# Patient Record
Sex: Female | Born: 1985 | Race: White | Hispanic: No | Marital: Married | Smoking: Never smoker
Health system: Southern US, Community
[De-identification: ages and names within clinical notes are randomized; demographics above are authoritative.]

## PROBLEM LIST (undated history)

## (undated) ENCOUNTER — Inpatient Hospital Stay (HOSPITAL_COMMUNITY): Payer: Self-pay

## (undated) DIAGNOSIS — F32A Depression, unspecified: Secondary | ICD-10-CM

## (undated) DIAGNOSIS — Z98891 History of uterine scar from previous surgery: Secondary | ICD-10-CM

## (undated) DIAGNOSIS — IMO0002 Reserved for concepts with insufficient information to code with codable children: Secondary | ICD-10-CM

## (undated) DIAGNOSIS — Z789 Other specified health status: Secondary | ICD-10-CM

## (undated) DIAGNOSIS — Q519 Congenital malformation of uterus and cervix, unspecified: Principal | ICD-10-CM

## (undated) DIAGNOSIS — K219 Gastro-esophageal reflux disease without esophagitis: Secondary | ICD-10-CM

## (undated) HISTORY — PX: APPENDECTOMY: SHX54

## (undated) HISTORY — PX: HYSTEROSCOPY: SHX211

## (undated) HISTORY — DX: Gastro-esophageal reflux disease without esophagitis: K21.9

## (undated) HISTORY — DX: Depression, unspecified: F32.A

---
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
## 2007-02-14 ENCOUNTER — Other Ambulatory Visit: Admission: RE | Admit: 2007-02-14 | Discharge: 2007-02-14 | Payer: Self-pay | Admitting: Gynecology

## 2010-12-05 NOTE — Op Note (Signed)
NAME:  Lindsay Horn, Lindsay Horn                     ACCOUNT NO.:  [id number]   MEDICAL RECORD NO.:  [id number]                   PATIENT TYPE:  OUT   LOCATION:  CATS                                 FACILITY:  [facility]   PHYSICIAN:  Prabhakar D. Pendse, M.D.           DATE OF BIRTH:  1985-12-02   DATE OF PROCEDURE:  05/01/2002  DATE OF DISCHARGE:                                 OPERATIVE REPORT   PREOPERATIVE DIAGNOSIS:  Acute appendicitis.   POSTOPERATIVE DIAGNOSIS:  Acute suppurative appendicitis, possible  perforation.   OPERATION PERFORMED:  Exploratory laparotomy and appendectomy.   SURGEON:  Prabhakar D. Levie Heritage, M.D.   ASSISTANT:  Leonia Corona, M.D.   ANESTHESIA:  Nurse.   OPERATIVE INDICATION:  This 25 year old girl was admitted with about a 30-  hour history of progressively worsening intermittent colicky, mid and lower  abdominal pains associated with anorexia,  nausea, vomiting, and low-grade  fever.  There was no history of abdominal trauma, no dysuria, no URI.  Physical findings were consistent with diffuse abdominal tenderness with  guarding in the right lower quadrant area.  There were no masses.  White  count initially was 9.2 with shift to the left.  Here and now, it is  unremarkable.  CT scan of the abdomen was questionable, hence the patient  was observed overnight, however since the symptomatology continued,  exploration was planned.   OPERATIVE FINDINGS:  Upon opening the right lower quadrant area, appendix  was noted to be lateral to the cecal wall with its distal half markedly  distended, edematous with some fibrinous material over the tip of the  appendix.  Although, no gross perforation was noted, there was some purulent  material in the right lower quadrant area.  There was an older suggestion of  possible earlier rupture.  Examination of the distal ileum showed no  evidence of ileitis or Meckel's diverticula.   OPERATIVE PROCEDURE:  Under satisfactory  general endotracheal anesthesia,  the patient in supine position.  The abdomen was thoroughly prepped and  draped in the usual manner.  About 2 inch long transverse incision was made  in the right lower quadrant area.  Skin and subcutaneous tissues were  incised.  Bleeders were individually clamped, cut and electrocoagulated.  Muscle was incised in the McBurney's fashion.  The peritoneal cavity was  entered.  The findings were as described above.  By gentle manipulation, the  appendix was exteriorized.  The appendiceal mesentery was serially clamped,  cut, and ligated with 2-0 silk.  Appendectomy was done in the routine  fashion.  The stump was inverted in the cecal wall with a 3-0 silk purse-  string suture.  Appropriate cultures were taken.  Examination of the distal  ileum was carried out.  There was no evidence of ileitis or Meckel's  diverticulum.  The bowel was returned to the peritoneal cavity.  The  peritoneal cavity was irrigated with copious amounts of saline.  Hemostasis  was accomplished.  Sponge and needle counts were correct.  The peritoneum  was closed with 2-0 Vicryl running interlocking sutures, individual muscles  with 2-0 Vicryl interrupted sutures, subcutaneous tissue with 2-0  Vicryl interrupted sutures, and the skin was closed with 4-0 Monocryl  subcuticular sutures.  Steri-Strips were applied.  Throughout the procedure,  the patient's vital signs remained stable.  The patient withstood the  procedure well and was transferred to the recovery room in satisfactory  general condition.                                                Prabhakar D. Levie Heritage, M.D.    PDP/MEDQ  D:  05/01/2002  T:  05/01/2002  Job:  [id number]   cc:   Linward Headland, M.D.

## 2010-12-05 NOTE — Discharge Summary (Signed)
   NAME:  Lindsay Horn, Lindsay Horn                     ACCOUNT NO.:  [id number]   MEDICAL RECORD NO.:  [id number]                   PATIENT TYPE:  INP   LOCATION:  6125                                 FACILITY:  [facility]   PHYSICIAN:  Prabhakar D. Pendse, M.D.           DATE OF BIRTH:  1986-05-10   DATE OF ADMISSION:  05/01/2002  DATE OF DISCHARGE:  05/04/2002                                 DISCHARGE SUMMARY   REASON FOR ADMISSION:  The patient is a 25 year old previously healthy  Caucasian female who was hospitalized status post appendectomy and possible  perforation performed by pediatric surgeon, Dr. Levie Heritage on 05/01/2002.   HOSPITAL COURSE:  Postoperative course was complicated by fever to 101.5 on  postoperative day #2.  Wound clean, dry, and intact.  Abdominal pain no  greater than expected.  The patient was on Unasyn 2 mg q.6h. IV x 72 hours  in the hospital. The patient was given Vicodin for pain.   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   LABORATORY DATA:  Final CBC on 05/04/2002 showed WBCs 5.2, hemoglobin 11.6,  hematocrit 35.0, platelets 167, neutrophils 68, lymphs 22, monos 8,  eosinophils 2, ANC 3.5.  Peritoneal culture was no growth x 2 days and was  final.   PROCEDURES:  Laparotomy and appendectomy performed on 05/01/2002.   CONDITION ON DISCHARGE:  The patient was taking p.o. well, minimal pain from  incision.  The pain was well controlled on Vicodin.  The patient was  afebrile.  The family was instructed to follow up with Dr. Levie Heritage in one  week appointment made for 05/11/2002 at 4 p.m.   DISCHARGE MEDICATIONS:  Vicodin 16 pills.     Salem Senate, M.S. 4                      Prabhakar D. Levie Heritage, M.D.    LT/MEDQ  D:  05/04/2002  T:  05/06/2002  Job:  [id number]   cc:   Dr. Meryl Crutch [phone]   Trudie Reed, MD  [facility] [phone])

## 2011-03-04 ENCOUNTER — Other Ambulatory Visit: Payer: Self-pay | Admitting: Dermatology

## 2011-08-11 ENCOUNTER — Other Ambulatory Visit: Payer: Self-pay | Admitting: Gynecology

## 2012-09-07 ENCOUNTER — Other Ambulatory Visit: Payer: Self-pay | Admitting: Gynecology

## 2013-02-05 ENCOUNTER — Ambulatory Visit (INDEPENDENT_AMBULATORY_CARE_PROVIDER_SITE_OTHER): Payer: BC Managed Care – PPO | Admitting: Emergency Medicine

## 2013-02-05 VITALS — BP 120/80 | HR 93 | Temp 98.0°F | Resp 16 | Ht 64.0 in | Wt 277.0 lb

## 2013-02-05 DIAGNOSIS — N764 Abscess of vulva: Secondary | ICD-10-CM

## 2013-02-05 MED ORDER — SULFAMETHOXAZOLE-TRIMETHOPRIM 800-160 MG PO TABS: 1.0000 | ORAL_TABLET | Freq: Two times a day (BID) | ORAL | Status: DC

## 2013-02-05 NOTE — Progress Notes (Signed)
[facility] [address] [place] [address] [phone]  Date:  02/05/2013   Name:  Lindsay Horn   DOB:  [birth year]   MRN:  [id number]  PCP:  No primary provider on file.    Chief Complaint: Vaginal Pain   History of Present Illness:  Lindsay Horn is a 27 y.o. very pleasant female patient who presents with the following:  Painful area in right posterior introitus.  No mass. No discharge or dysuria. No fever or chills.  Last intercourse Monday.  On NUVA ring.  No improvement with over the counter medications or other home remedies. Denies other complaint or health concern today.   There are no active problems to display for this patient.   History reviewed. No pertinent past medical history.  Past Surgical History  Procedure Laterality Date  . Appendectomy      History  Substance Use Topics  . Smoking status: Never Smoker   . Smokeless tobacco: Not on file  . Alcohol Use: Yes    History reviewed. No pertinent family history.  Allergies  Allergen Reactions  . Codeine Hives    Medication list has been reviewed and updated.  No current outpatient prescriptions on file prior to visit.   No current facility-administered medications on file prior to visit.    Review of Systems:  As per HPI, otherwise negative.    Physical Examination: Filed Vitals:   02/05/13 0957  BP: 120/80  Pulse: 93  Temp: 98 F (36.7 C)  Resp: 16   Filed Vitals:   02/05/13 0957  Height: 5\' 4"  (1.626 m)  Weight: 277 lb (125.646 kg)   Body mass index is 47.52 kg/(m^2). Ideal Body Weight: Weight in (lb) to have BMI = 25: 145.3   GEN: WDWN, NAD, Non-toxic, Alert & Oriented x 3 HEENT: Atraumatic, Normocephalic.  Ears and Nose: No external deformity. EXTR: No clubbing/cyanosis/edema NEURO: Normal gait.  PSYCH: Normally interactive. Conversant. Not depressed or anxious appearing.  Calm demeanor.  Abscess posterior right labia  majora   Assessment and Plan: Aspirated 1.5 ml purulent material Septra SITZ Gyn consultation  Signed,  Phillips Odor, MD

## 2013-02-05 NOTE — Addendum Note (Signed)
Addended by: Elease Etienne A on: 02/05/2013 10:53 AM   Modules accepted: Orders

## 2013-02-05 NOTE — Patient Instructions (Addendum)
Abscess An abscess is an infected area that contains a collection of pus and debris.It can occur in almost any part of the body. An abscess is also known as a furuncle or boil. CAUSES  An abscess occurs when tissue gets infected. This can occur from blockage of oil or sweat glands, infection of hair follicles, or a minor injury to the skin. As the body tries to fight the infection, pus collects in the area and creates pressure under the skin. This pressure causes pain. People with weakened immune systems have difficulty fighting infections and get certain abscesses more often.  SYMPTOMS Usually an abscess develops on the skin and becomes a painful mass that is red, warm, and tender. If the abscess forms under the skin, you may feel a moveable soft area under the skin. Some abscesses break open (rupture) on their own, but most will continue to get worse without care. The infection can spread deeper into the body and eventually into the bloodstream, causing you to feel ill.  DIAGNOSIS  Your caregiver will take your medical history and perform a physical exam. A sample of fluid may also be taken from the abscess to determine what is causing your infection. TREATMENT  Your caregiver may prescribe antibiotic medicines to fight the infection. However, taking antibiotics alone usually does not cure an abscess. Your caregiver may need to make a small cut (incision) in the abscess to drain the pus. In some cases, gauze is packed into the abscess to reduce pain and to continue draining the area. HOME CARE INSTRUCTIONS   Only take over-the-counter or prescription medicines for pain, discomfort, or fever as directed by your caregiver.  If you were prescribed antibiotics, take them as directed. Finish them even if you start to feel better.  If gauze is used, follow your caregiver's directions for changing the gauze.  To avoid spreading the infection:  Keep your draining abscess covered with a  bandage.  Wash your hands well.  Do not share personal care items, towels, or whirlpools with others.  Avoid skin contact with others.  Keep your skin and clothes clean around the abscess.  Keep all follow-up appointments as directed by your caregiver. SEEK MEDICAL CARE IF:   You have increased pain, swelling, redness, fluid drainage, or bleeding.  You have muscle aches, chills, or a general ill feeling.  You have a fever. MAKE SURE YOU:   Understand these instructions.  Will watch your condition.  Will get help right away if you are not doing well or get worse. Document Released: 04/15/2005 Document Revised: 01/05/2012 Document Reviewed: 09/18/2011 ExitCare Patient Information 2014 [employer].  

## 2013-02-07 LAB — WOUND CULTURE: Gram Stain: NONE SEEN

## 2013-04-12 ENCOUNTER — Ambulatory Visit (INDEPENDENT_AMBULATORY_CARE_PROVIDER_SITE_OTHER): Payer: BC Managed Care – PPO

## 2013-04-12 VITALS — BP 126/80 | HR 82 | Temp 99.1°F | Resp 16 | Ht 64.5 in | Wt 227.4 lb

## 2013-04-12 DIAGNOSIS — Z111 Encounter for screening for respiratory tuberculosis: Secondary | ICD-10-CM

## 2013-04-12 NOTE — Progress Notes (Signed)
  Subjective:    Patient ID: Lindsay Horn, female    DOB: 1986-06-30, 27 y.o.   MRN: [id number]  HPI   Tuberculosis Risk Questionnaire  1. No Were you born outside the [place] in one of the following parts of the world: [place], [place], [place], [place] or [place]?    2. No Have you traveled outside the [place] and lived for more than one month in one of the following parts of the world: [place], [place], [place], [place] or [place]?    3. No Do you have a compromised immune system such as from any of the following conditions:HIV/AIDS, organ or bone marrow transplantation, diabetes, immunosuppressive medicines (e.g. Prednisone, Remicaide), leukemia, lymphoma, cancer of the head or neck, gastrectomy or jejunal bypass, end-stage renal disease (on dialysis), or silicosis?     4. No Have you ever or do you plan on working in: a residential care center, a health care facility, a jail or prison or homeless shelter?    5. No Have you ever: injected illegal drugs, used crack cocaine, lived in a homeless shelter  or been in jail or prison?     6. No Have you ever been exposed to anyone with infectious tuberculosis?    Tuberculosis Symptom Questionnaire  Do you currently have any of the following symptoms?  1. No Unexplained cough lasting more than 3 weeks?   2. No Unexplained fever lasting more than 3 weeks.   3. No Night Sweats (sweating that leaves the bedclothes and sheets wet)     4. No Shortness of Breath   5. No Chest Pain   6. No Unintentional weight loss    7. No Unexplained fatigue (very tired for no reason)     Review of Systems     Objective:   Physical Exam        Assessment & Plan:    Pt screened for TB and it is not indicated. Note given to pt. Reviewed with Frances Furbish PA-C

## 2013-05-18 ENCOUNTER — Ambulatory Visit (INDEPENDENT_AMBULATORY_CARE_PROVIDER_SITE_OTHER): Payer: BC Managed Care – PPO | Admitting: Family Medicine

## 2013-05-18 ENCOUNTER — Encounter: Payer: Self-pay | Admitting: Family Medicine

## 2013-05-18 VITALS — BP 112/68 | HR 90 | Temp 98.8°F | Resp 16 | Ht 64.5 in | Wt 276.6 lb

## 2013-05-18 DIAGNOSIS — Z0000 Encounter for general adult medical examination without abnormal findings: Secondary | ICD-10-CM

## 2013-05-18 NOTE — Progress Notes (Signed)
[facility]:  Office Visit  Chief Complaint:  Chief Complaint  Patient presents with  . Annual Exam    employment    HPI: Lindsay Horn is a 27 y.o. female who is here for here for CPE She is teaching at [employer], 1st grade teacher.  She started in Sept.  Declines bloord work She has a gynecologist No abnormal paps, last pap was in March 2014  History reviewed. No pertinent past medical history. Past Surgical History  Procedure Laterality Date  . Appendectomy     History   Social History  . Marital Status: Single    Spouse Name: N/A    Number of Children: N/A  . Years of Education: N/A   Social History Main Topics  . Smoking status: Never Smoker   . Smokeless tobacco: None  . Alcohol Use: Yes  . Drug Use: No  . Sexual Activity: Yes   Other Topics Concern  . None   Social History Narrative  . None   History reviewed. No pertinent family history. Allergies  Allergen Reactions  . Codeine Hives   Prior to Admission medications   Medication Sig Start Date End Date Taking? Authorizing Provider  etonogestrel-ethinyl estradiol (NUVARING) 0.12-0.015 MG/24HR vaginal ring Place 1 each vaginally every 28 (twenty-eight) days. Insert vaginally and leave in place for 3 consecutive weeks, then remove for 1 week.   Yes Historical Provider, MD  sulfamethoxazole-trimethoprim (BACTRIM DS,SEPTRA DS) 800-160 MG per tablet Take 1 tablet by mouth 2 (two) times daily. 02/05/13   Phillips Odor, MD     ROS: The patient denies fevers, chills, night sweats, unintentional weight loss, chest pain, palpitations, wheezing, dyspnea on exertion, nausea, vomiting, abdominal pain, dysuria, hematuria, melena, numbness, weakness, or tingling.   All other systems have been reviewed and were otherwise negative with the exception of those mentioned in the HPI and as above.    PHYSICAL EXAM: Filed Vitals:   05/18/13 1629  BP: 112/68  Pulse: 90  Temp:  98.8 F (37.1 C)  Resp: 16   Filed Vitals:   05/18/13 1629  Height: 5' 4.5" (1.638 m)  Weight: 276 lb 9.6 oz (125.465 kg)   Body mass index is 46.76 kg/(m^2).  General: Alert, no acute distress HEENT:  Normocephalic, atraumatic, oropharynx patent. EOMI, PERRLA, fundoscopic exam nl, no thyroidmegaly Cardiovascular:  Regular rate and rhythm, no rubs murmurs or gallops.  No Carotid bruits, radial pulse intact. No pedal edema.  Respiratory: Clear to auscultation bilaterally.  No wheezes, rales, or rhonchi.  No cyanosis, no use of accessory musculature GI: No organomegaly, abdomen is soft and non-tender, positive bowel sounds.  No masses. Skin: No rashes. Neurologic: Facial musculature symmetric. Psychiatric: Patient is appropriate throughout our interaction. Lymphatic: No cervical lymphadenopathy Musculoskeletal: Gait intact. 5/5 strngth, full ROM in UE and Lindsay Horn , 2/2 DTRs   LABS: Results for orders placed in visit on 02/05/13  WOUND CULTURE      Result Value Range   Culture Moderate ESCHERICHIA COLI     Gram Stain No WBC Seen     Gram Stain No Squamous Epithelial Cells Seen     Gram Stain Moderate Gram Negative Rods     Organism ID, Bacteria ESCHERICHIA COLI       EKG/XRAY:   Primary read interpreted by Dr. Conley Rolls at [facility].   ASSESSMENT/PLAN: Encounter Diagnosis  Name Primary?  . Annual physical exam Yes   Declined labs , breast or pelvic/pap exam She gets  it with ob/gyn and will do that in March Filled out forms for school, no restrictions F/u prn  Gross sideeffects, risk and benefits, and alternatives of medications d/w patient. Patient is aware that all medications have potential sideeffects and we are unable to predict every sideeffect or drug-drug interaction that may occur.  Hamilton Capri PHUONG, DO 05/18/2013 4:56 PM

## 2014-01-23 ENCOUNTER — Ambulatory Visit (INDEPENDENT_AMBULATORY_CARE_PROVIDER_SITE_OTHER): Payer: BC Managed Care – PPO

## 2014-01-23 ENCOUNTER — Telehealth: Payer: Self-pay | Admitting: *Deleted

## 2014-01-23 VITALS — BP 127/62 | HR 82 | Resp 12

## 2014-01-23 DIAGNOSIS — R52 Pain, unspecified: Secondary | ICD-10-CM

## 2014-01-23 DIAGNOSIS — I872 Venous insufficiency (chronic) (peripheral): Secondary | ICD-10-CM

## 2014-01-23 DIAGNOSIS — R609 Edema, unspecified: Secondary | ICD-10-CM

## 2014-01-23 DIAGNOSIS — M722 Plantar fascial fibromatosis: Secondary | ICD-10-CM

## 2014-01-23 MED ORDER — MELOXICAM 15 MG PO TABS: 15.0000 mg | ORAL_TABLET | Freq: Every day | ORAL | Status: DC

## 2014-01-23 NOTE — Patient Instructions (Signed)
Plantar Fasciitis Plantar fasciitis is a common condition that causes foot pain. It is soreness (inflammation) of the band of tough fibrous tissue on the bottom of the foot that runs from the heel bone (calcaneus) to the ball of the foot. The cause of this soreness may be from excessive standing, poor fitting shoes, running on hard surfaces, being overweight, having an abnormal walk, or overuse (this is common in runners) of the painful foot or feet. It is also common in aerobic exercise dancers and ballet dancers. SYMPTOMS  Most people with plantar fasciitis complain of:  Severe pain in the morning on the bottom of their foot especially when taking the first steps out of bed. This pain recedes after a few minutes of walking.  Severe pain is experienced also during walking following a long period of inactivity.  Pain is worse when walking barefoot or up stairs DIAGNOSIS   Your caregiver will diagnose this condition by examining and feeling your foot.  Special tests such as X-rays of your foot, are usually not needed. PREVENTION   Consult a sports medicine professional before beginning a new exercise program.  Walking programs offer a good workout. With walking there is a lower chance of overuse injuries common to runners. There is less impact and less jarring of the joints.  Begin all new exercise programs slowly. If problems or pain develop, decrease the amount of time or distance until you are at a comfortable level.  Wear good shoes and replace them regularly.  Stretch your foot and the heel cords at the back of the ankle (Achilles tendon) both before and after exercise.  Run or exercise on even surfaces that are not hard. For example, asphalt is better than pavement.  Do not run barefoot on hard surfaces.  If using a treadmill, vary the incline.  Do not continue to workout if you have foot or joint problems. Seek professional help if they do not improve. HOME CARE INSTRUCTIONS     Avoid activities that cause you pain until you recover.  Use ice or cold packs on the problem or painful areas after working out.  Only take over-the-counter or prescription medicines for pain, discomfort, or fever as directed by your caregiver.  Soft shoe inserts or athletic shoes with air or gel sole cushions may be helpful.  If problems continue or become more severe, consult a sports medicine caregiver or your own health care provider. Cortisone is a potent anti-inflammatory medication that may be injected into the painful area. You can discuss this treatment with your caregiver. MAKE SURE YOU:   Understand these instructions.  Will watch your condition.  Will get help right away if you are not doing well or get worse. Document Released: 03/31/2001 Document Revised: 09/28/2011 Document Reviewed: 05/30/2008 ExitCare Patient Information 2015 [employer]. This information is not intended to replace advice given to you by your health care provider. Make sure you discuss any questions you have with your health care provider.    ICE INSTRUCTIONS  Apply ice or cold pack to the affected area at least 3 times a day for 10-15 minutes each time.  You should also use ice after prolonged activity or vigorous exercise.  Do not apply ice longer than 20 minutes at one time.  Always keep a cloth between your skin and the ice pack to prevent burns.  Being consistent and following these instructions will help control your symptoms.  We suggest you purchase a gel ice pack because they are   reusable and do bit leak.  Some of them are designed to wrap around the area.  Use the method that works best for you.  Here are some other suggestions for icing.   Use a frozen bag of peas or corn-inexpensive and molds well to your body, usually stays frozen for 10 to 20 minutes.  Wet a towel with cold water and squeeze out the excess until it's damp.  Place in a bag in the freezer for 20 minutes. Then remove  and use. 

## 2014-01-23 NOTE — Progress Notes (Signed)
   Subjective:    Patient ID: Rosalva Ferron, female    DOB: 29-Jan-1986, 28 y.o.   MRN: [id number]  HPI  PT STATED LT BOTTOM OF THE FOOT IS HURTING AND SWOLLEN FOR 1 YEAR. THE FOOT IS GETTING WORSE. THE FOOT GETTING WORSE AND IT AGGRAVATED BY WALKING. TRIED TO USED ADVIL, ICE, AND COMPRESSION SOCKS AND IT HELPS SOME.   Review of Systems  Cardiovascular: Positive for leg swelling.       Objective:   Physical Exam 28 year old white female well-developed well-nourished oriented x3. She is slightly overweight her has significant swelling and edema left more so than right lower leg and ankle areas. Has significant swelling in the left forefoot as well.  Lower straight objective findings vascular status is intact with pedal pulses palpable DP +2/4 PT plus one over 4 bilateral capillary fill time 3 seconds all digits epicritic and proprioceptive sensations intact and symmetric bilateral. There is +2 edema on the left plus one on the right. No open wounds or ulcerations are noted on exam the patient has some diffuse tenderness in the left forefoot possibly early neuroma symptomology capsulitis to the edema. Patient also exam has pain tenderness on palpation of the plantar fascia a mid arch area from inferior heel to arch although not significant there is x-rays reveal mild inferior calcaneal spurring thickening plantar fascial structures no signs of fracture or other osseous abnormalities are noted rectus foot otherwise identified. Significant soft tissue swelling is noted. Remainder the exam is unremarkable mild flexible digital contractures are noted       Assessment & Plan:  Assessment this time mild early plantar fasciitis secondary gait abnormalities and edema may cannot rule out mild early neuroma symptomology do the edema and compression with slight capsulitis being noted as well. However the [employer] for issues is the edema possible of edema associated left foot more so than right  patient has tried compression stockings in the past to help temporarily advised she is to maintain compression stockings and prescription for [facility] for knee-high compression stocking is dispensed. Patient is also referred to the pain center at [facility] for evaluation of lymphedema and appropriate treatment patient is advised that is cosmetic that may be, leave things like blood clots ulcerations or skin breakdown. In the interim will initiate compression stockings as prescribed also prescription for MOBIC 15 mg once daily is given at this time. Reevaluate in 3 or 4 weeks for followup she continues have problems or difficulties may be candidate for her fasciitis treatment possibly the orthoses Nadara Mustard this time with the edema I do feel she would have difficulty with any type shoe treatments or conservative care which avoid strapping do the edema as well this time. In reappointed 4 weeks for for Dr. Dierdre Harness is made at this time .  Harriet Masson DPM

## 2014-01-23 NOTE — Telephone Encounter (Signed)
Referral was made for [facility] for chronic edema left leg more so than the right leg, rule out Lymphedema per Dr. Blenda Mounts.  Appointment was scheduled for 01/24/2014 at 11:30am.  Diagnosis is Venous Insufficiency, symptoms is leg swelling.   [facility] [id number] [place], [place] [address] Fax # [phone]

## 2014-02-09 ENCOUNTER — Telehealth: Payer: Self-pay | Admitting: *Deleted

## 2014-02-09 NOTE — Telephone Encounter (Signed)
I saw him on 07/07.  I went to [facility] to get the compression the same day that I came there.  They had to order them.  They have not come in yet.  Should I postpone my appointment to see him since I haven't used them yet?  Seen a little improvement with the swelling and Plantar Fasciitis.   I told her to keep the appointment, he is following up on the Plantar Fasciitis.  She stated okay thank you.

## 2014-02-14 ENCOUNTER — Ambulatory Visit (INDEPENDENT_AMBULATORY_CARE_PROVIDER_SITE_OTHER): Payer: BC Managed Care – PPO

## 2014-02-14 VITALS — BP 191/97 | HR 85 | Resp 17

## 2014-02-14 DIAGNOSIS — I872 Venous insufficiency (chronic) (peripheral): Secondary | ICD-10-CM

## 2014-02-14 DIAGNOSIS — M722 Plantar fascial fibromatosis: Secondary | ICD-10-CM

## 2014-02-14 DIAGNOSIS — R609 Edema, unspecified: Secondary | ICD-10-CM

## 2014-02-14 NOTE — Progress Notes (Signed)
Subjective:     Patient ID: Rosalva Ferron, female   DOB: Dec 13, 1985, 28 y.o.   MRN: [id number]  HPI Pt presents for f/u for pf pain in left foot, states she has noticed some improvement in pain but still has bilateral edema.  Review of Systems no new findings or systemic changes noted     Objective:   Physical Exam Lower extremity objective findings as follows vascular status is intact there still edema left more so than right +2 on the left was 1 on the right patient had vascular studies revealed no blockages no signs of clots or any other vascular abnormality recommended to continue with compression stockings that were prescribed. Masker status is intact pedal pulses DP postal for PT plus one over 4 bilateral refill time 3 seconds. There is no appreciable pain on palpation of the medial band of the plantar fascia left or right foot patient wearing Birkenstock type shoes crocs in sandals at all times no barefoot or flimsy shoes or flip-flops    Assessment:     Assessment this time is venous insufficiency and edema patient we started wearing compression stockings today the last prescription and she did get her stockings until this week. Advised to maintain compression stockings daily as instructed for the next 2-3 months to help with improved patient's plantar fasciitis has resolved well with use a good stable shoes may be candidate for functional orthoses in the future    Plan:     Maintain ice elevation and compression stockings to keep down the edema maintain good stable athletic or walking shoes may be candidate for functional orthoses in the future based on progress her failure to improve next  Harriet Masson DPM

## 2014-02-14 NOTE — Patient Instructions (Signed)
ICE INSTRUCTIONS  Apply ice or cold pack to the affected area at least 3 times a day for 10-15 minutes each time.  You should also use ice after prolonged activity or vigorous exercise.  Do not apply ice longer than 20 minutes at one time.  Always keep a cloth between your skin and the ice pack to prevent burns.  Being consistent and following these instructions will help control your symptoms.  We suggest you purchase a gel ice pack because they are reusable and do bit leak.  Some of them are designed to wrap around the area.  Use the method that works best for you.  Here are some other suggestions for icing.   Use a frozen bag of peas or corn-inexpensive and molds well to your body, usually stays frozen for 10 to 20 minutes.  Wet a towel with cold water and squeeze out the excess until it's damp.  Place in a bag in the freezer for 20 minutes. Then remove and use  Also keep feet elevated whenever possible and maintain the use of compression stockings as instructed  .

## 2014-02-27 ENCOUNTER — Ambulatory Visit (INDEPENDENT_AMBULATORY_CARE_PROVIDER_SITE_OTHER): Payer: BC Managed Care – PPO | Admitting: Family Medicine

## 2014-02-27 ENCOUNTER — Ambulatory Visit (INDEPENDENT_AMBULATORY_CARE_PROVIDER_SITE_OTHER): Payer: BC Managed Care – PPO

## 2014-02-27 VITALS — BP 110/60 | HR 78 | Temp 98.0°F | Resp 16 | Ht 64.5 in | Wt 287.0 lb

## 2014-02-27 DIAGNOSIS — S6390XA Sprain of unspecified part of unspecified wrist and hand, initial encounter: Secondary | ICD-10-CM

## 2014-02-27 DIAGNOSIS — S6392XA Sprain of unspecified part of left wrist and hand, initial encounter: Secondary | ICD-10-CM

## 2014-02-27 DIAGNOSIS — E669 Obesity, unspecified: Secondary | ICD-10-CM | POA: Insufficient documentation

## 2014-02-27 NOTE — Patient Instructions (Signed)
Use the wrist brace as needed for thumb soreness.  You can also use ice and ibuprofen as needed.  Let us know if you thumb is not better in the next few days- Sooner if worse.

## 2014-02-27 NOTE — Progress Notes (Signed)
[facility] [address] [place] [address] [phone]  Date:  02/27/2014   Name:  Lindsay Horn   DOB:  [birth year]   MRN:  [id number]  PCP:  Haywood Pao, MD    Chief Complaint: Finger Injury, Edema and Bleeding/Bruising   History of Present Illness:  Lindsay Horn is a 28 y.o. very pleasant female patient who presents with the following:  She is here today with a left thum injury that occurred yesterday while tubing behind a boat yesterday.  She fell and caught her thumb in the handle of the tube.  It swelled and hurt right away- she noted notes bruise and continued swelling. No other injury, she otherwise feels well  There are no active problems to display for this patient.   History reviewed. No pertinent past medical history.  Past Surgical History  Procedure Laterality Date  . Appendectomy      History  Substance Use Topics  . Smoking status: Never Smoker   . Smokeless tobacco: Not on file  . Alcohol Use: Yes    History reviewed. No pertinent family history.  Allergies  Allergen Reactions  . Codeine Hives    Medication list has been reviewed and updated.  Current Outpatient Prescriptions on File Prior to Visit  Medication Sig Dispense Refill  . ibuprofen (ADVIL,MOTRIN) 100 MG tablet Take 100 mg by mouth every 6 (six) hours as needed for fever.      . meloxicam (MOBIC) 15 MG tablet Take 1 tablet (15 mg total) by mouth daily.  30 tablet  1  . Multiple Vitamin (MULTIVITAMIN) capsule Take 1 capsule by mouth daily.       No current facility-administered medications on file prior to visit.    Review of Systems:  As per HPI- otherwise negative.  Physical Examination: Filed Vitals:   02/27/14 1440  BP: 110/60  Pulse: 78  Temp: 98 F (36.7 C)  Resp: 16   Filed Vitals:   02/27/14 1440  Height: 5' 4.5" (1.638 m)  Weight: 287 lb (130.182 kg)   Body mass index is 48.52 kg/(m^2). Ideal Body Weight: Weight in  (lb) to have BMI = 25: 147.6  GEN: WDWN, NAD, Non-toxic, A & O x 3, obese, looks well HEENT: Atraumatic, Normocephalic. Neck supple. No masses, No LAD. Ears and Nose: No external deformity. CV: RRR, No M/G/R. No JVD. No thrill. No extra heart sounds. PULM: CTA B, no wheezes, crackles, rhonchi. No retractions. No resp. distress. No accessory muscle use. EXTR: No c/c/e NEURO Normal gait.  PSYCH: Normally interactive. Conversant. Not depressed or anxious appearing.  Calm demeanor.  Left thumb: she is tender and bruised over the 1st MC.  Pain with movement of the first MCP joint, but thumb IP joint is non- tender. She has good movement of the IP joint but cannot adduct her thumb well due to pain  She is right handed  [facility] reading (PRIMARY) by  Dr. Lorelei Pont. Left hand: no fracture  Assessment and Plan: Sprain of left hand, initial encounter - Plan: DG Hand Complete Left  Placed in a left thumb spica splint for thumb sprain.  She will let me know if not better in the next few days- Sooner if worse.     Signed Lamar Blinks, MD

## 2014-05-16 ENCOUNTER — Ambulatory Visit: Payer: BC Managed Care – PPO

## 2014-12-11 ENCOUNTER — Encounter (HOSPITAL_COMMUNITY): Payer: Self-pay | Admitting: Obstetrics and Gynecology

## 2014-12-11 DIAGNOSIS — IMO0002 Reserved for concepts with insufficient information to code with codable children: Secondary | ICD-10-CM

## 2014-12-11 HISTORY — DX: Reserved for concepts with insufficient information to code with codable children: IMO0002

## 2014-12-11 NOTE — H&P (Addendum)
Lindsay Horn is a 29 y.o. female G1P0 at 9+ with IUFD, noted in office by [place] after presentation for VB and cramping/back pain.  FHTs unable to be obtained externally, Formal US reveals IUFD.  Pt with known bicornuate uterus.  Femur length c/w 13 + week, last OB visit, FHT at this time by BS [place].  D/W pt options formanagement.  D/W pt r/b/a - pts for D&E - will perform with US guidance.    Maternal Medical History:  Reason for admission: Nausea.     OB History    No data available     No abn pap, no STD Known bicornuate uterus  Past Medical History  Diagnosis Date  . Fetal demise, less than 22 weeks 12/11/2014  dysmenorrhea, irregular cycles - PCOS.  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   HENT: Negative.   Eyes: Negative.   Respiratory: Negative.   Cardiovascular: Negative.   Gastrointestinal: Positive for nausea and vomiting.  Genitourinary: Negative.   Musculoskeletal: Positive for back pain.  Skin: Negative.   Neurological: Negative.   Psychiatric/Behavioral: Negative.       There were no vitals taken for this visit. Maternal Exam:  Abdomen: Patient reports no abdominal tenderness. Introitus: Normal vulva. Normal vagina.    Physical Exam  Constitutional: She is oriented to person, place, and time. She appears well-developed and well-nourished.  obese  HENT:  Head: Normocephalic and atraumatic.  Cardiovascular: Normal rate and regular rhythm.   Respiratory: Effort normal and breath sounds normal. No respiratory distress. She has no wheezes.  GI: Soft. Bowel sounds are normal. She exhibits no distension. There is no tenderness.  Musculoskeletal: Normal range of motion.  Neurological: She is alert and oriented to person, place, and time.   Skin: Skin is warm and dry.  Psychiatric: She has a normal mood and affect. Her behavior is normal.    Prenatal labs: ABO, Rh:  A+ Antibody:  negative Rubella:  immune RPR:   NR HBsAg:   negative HIV:   negative GBS:   unknown  Assessment/Plan: 28yo G1 at 16+ with IUFD, measuring 13+ week D/w pt r/b/a of D&E with US guidance Doxycycline for prophylaxis     Bovard-Stuckert, Beulah Capobianco 12/11/2014, 9:25 PM

## 2014-12-12 ENCOUNTER — Encounter (HOSPITAL_COMMUNITY): Payer: Self-pay | Admitting: *Deleted

## 2014-12-12 ENCOUNTER — Ambulatory Visit (HOSPITAL_COMMUNITY): Payer: BC Managed Care – PPO | Admitting: Anesthesiology

## 2014-12-12 ENCOUNTER — Ambulatory Visit (HOSPITAL_COMMUNITY)
Admission: RE | Admit: 2014-12-12 | Discharge: 2014-12-12 | Disposition: A | Discharge status: Home / Self Care | Payer: BC Managed Care – PPO | Source: Ambulatory Visit | Attending: Obstetrics and Gynecology | Admitting: Obstetrics and Gynecology

## 2014-12-12 ENCOUNTER — Ambulatory Visit (HOSPITAL_COMMUNITY): Payer: BC Managed Care – PPO

## 2014-12-12 ENCOUNTER — Encounter (HOSPITAL_COMMUNITY)
Admission: RE | Disposition: A | Discharge status: Home / Self Care | Payer: Self-pay | Source: Ambulatory Visit | Attending: Obstetrics and Gynecology

## 2014-12-12 DIAGNOSIS — O021 Missed abortion: Secondary | ICD-10-CM | POA: Diagnosis not present

## 2014-12-12 DIAGNOSIS — O364XX0 Maternal care for intrauterine death, not applicable or unspecified: Secondary | ICD-10-CM

## 2014-12-12 DIAGNOSIS — IMO0002 Reserved for concepts with insufficient information to code with codable children: Secondary | ICD-10-CM | POA: Diagnosis present

## 2014-12-12 HISTORY — DX: Reserved for concepts with insufficient information to code with codable children: IMO0002

## 2014-12-12 HISTORY — PX: DILATION AND EVACUATION: SHX1459

## 2014-12-12 HISTORY — DX: Other specified health status: Z78.9

## 2014-12-12 LAB — CBC
HEMATOCRIT: 40.1 % (ref 36.0–46.0)
Hemoglobin: 13.4 g/dL (ref 12.0–15.0)
MCH: 28.5 pg (ref 26.0–34.0)
MCHC: 33.4 g/dL (ref 30.0–36.0)
MCV: 85.3 fL (ref 78.0–100.0)
PLATELETS: 215 10*3/uL (ref 150–400)
RBC: 4.7 MIL/uL (ref 3.87–5.11)
RDW: 15.4 % (ref 11.5–15.5)
WBC: 8.6 10*3/uL (ref 4.0–10.5)

## 2014-12-12 SURGERY — DILATION AND EVACUATION, UTERUS, SECOND TRIMESTER
Anesthesia: General | Site: Uterus

## 2014-12-12 MED ORDER — FENTANYL CITRATE (PF) 100 MCG/2ML IJ SOLN
INTRAMUSCULAR | Status: AC
  Filled 2014-12-12: qty 4

## 2014-12-12 MED ORDER — LIDOCAINE HCL 1 % IJ SOLN
INTRAMUSCULAR | Status: DC | PRN
  Administered 2014-12-12: 10 mL

## 2014-12-12 MED ORDER — DOXYCYCLINE HYCLATE 100 MG PO TABS
100.0000 mg | ORAL_TABLET | Freq: Once | ORAL | Status: AC
  Administered 2014-12-12: 100 mg via ORAL

## 2014-12-12 MED ORDER — PROPOFOL 10 MG/ML IV BOLUS
INTRAVENOUS | Status: AC
  Filled 2014-12-12: qty 20

## 2014-12-12 MED ORDER — GLYCOPYRROLATE 0.2 MG/ML IJ SOLN
INTRAMUSCULAR | Status: DC | PRN
  Administered 2014-12-12: 0.1 mg via INTRAVENOUS

## 2014-12-12 MED ORDER — MIDAZOLAM HCL 2 MG/2ML IJ SOLN
INTRAMUSCULAR | Status: AC
  Filled 2014-12-12: qty 2

## 2014-12-12 MED ORDER — FENTANYL CITRATE (PF) 250 MCG/5ML IJ SOLN
INTRAMUSCULAR | Status: DC | PRN
Start: 2014-12-12 — End: 2014-12-12
  Administered 2014-12-12 (×2): 50 ug via INTRAVENOUS

## 2014-12-12 MED ORDER — ONDANSETRON HCL 4 MG/2ML IJ SOLN
INTRAMUSCULAR | Status: AC
  Filled 2014-12-12: qty 2

## 2014-12-12 MED ORDER — METOCLOPRAMIDE HCL 5 MG/ML IJ SOLN
INTRAMUSCULAR | Status: DC | PRN
  Administered 2014-12-12: 10 mg via INTRAVENOUS

## 2014-12-12 MED ORDER — DOXYCYCLINE HYCLATE 100 MG PO TABS
ORAL_TABLET | ORAL | Status: AC
  Administered 2014-12-12: 100 mg via ORAL
  Filled 2014-12-12: qty 1

## 2014-12-12 MED ORDER — SODIUM CHLORIDE 0.9 % IJ SOLN
INTRAMUSCULAR | Status: AC
  Filled 2014-12-12: qty 10

## 2014-12-12 MED ORDER — GLYCOPYRROLATE 0.2 MG/ML IJ SOLN
INTRAMUSCULAR | Status: AC
  Filled 2014-12-12: qty 1

## 2014-12-12 MED ORDER — ROCURONIUM BROMIDE 100 MG/10ML IV SOLN
INTRAVENOUS | Status: DC | PRN
  Administered 2014-12-12: 2 mg via INTRAVENOUS

## 2014-12-12 MED ORDER — SUCCINYLCHOLINE CHLORIDE 20 MG/ML IJ SOLN
INTRAMUSCULAR | Status: DC | PRN
  Administered 2014-12-12: 120 mg via INTRAVENOUS

## 2014-12-12 MED ORDER — NEOSTIGMINE METHYLSULFATE 10 MG/10ML IV SOLN
INTRAVENOUS | Status: AC
  Filled 2014-12-12: qty 1

## 2014-12-12 MED ORDER — ROCURONIUM BROMIDE 100 MG/10ML IV SOLN
INTRAVENOUS | Status: AC
  Filled 2014-12-12: qty 1

## 2014-12-12 MED ORDER — HYDROCODONE-ACETAMINOPHEN 7.5-325 MG PO TABS: 1.0000 | ORAL_TABLET | Freq: Once | ORAL | Status: DC | PRN

## 2014-12-12 MED ORDER — PROPOFOL 10 MG/ML IV BOLUS
INTRAVENOUS | Status: DC | PRN
  Administered 2014-12-12: 30 mg via INTRAVENOUS
  Administered 2014-12-12: 200 mg via INTRAVENOUS

## 2014-12-12 MED ORDER — LIDOCAINE HCL 1 % IJ SOLN
INTRAMUSCULAR | Status: AC
  Filled 2014-12-12: qty 20

## 2014-12-12 MED ORDER — LACTATED RINGERS IV SOLN: INTRAVENOUS | Status: DC

## 2014-12-12 MED ORDER — IBUPROFEN 800 MG PO TABS: 800.0000 mg | ORAL_TABLET | Freq: Three times a day (TID) | ORAL | Status: DC | PRN

## 2014-12-12 MED ORDER — SCOPOLAMINE 1 MG/3DAYS TD PT72
MEDICATED_PATCH | TRANSDERMAL | Status: AC
  Filled 2014-12-12: qty 1

## 2014-12-12 MED ORDER — KETOROLAC TROMETHAMINE 30 MG/ML IJ SOLN
INTRAMUSCULAR | Status: DC | PRN
  Administered 2014-12-12: 30 mg via INTRAVENOUS

## 2014-12-12 MED ORDER — METOCLOPRAMIDE HCL 5 MG/ML IJ SOLN: 10.0000 mg | Freq: Once | INTRAMUSCULAR | Status: DC | PRN

## 2014-12-12 MED ORDER — MIDAZOLAM HCL 2 MG/2ML IJ SOLN
INTRAMUSCULAR | Status: DC | PRN
  Administered 2014-12-12: 2 mg via INTRAVENOUS

## 2014-12-12 MED ORDER — FENTANYL CITRATE (PF) 100 MCG/2ML IJ SOLN: 25.0000 ug | INTRAMUSCULAR | Status: DC | PRN

## 2014-12-12 MED ORDER — DOXYCYCLINE HYCLATE 100 MG PO TABS
ORAL_TABLET | ORAL | Status: AC
  Administered 2014-12-12: 200 mg via ORAL
  Filled 2014-12-12: qty 2

## 2014-12-12 MED ORDER — DOXYCYCLINE HYCLATE 100 MG PO TABS
200.0000 mg | ORAL_TABLET | Freq: Once | ORAL | Status: AC
  Administered 2014-12-12: 200 mg via ORAL

## 2014-12-12 MED ORDER — LACTATED RINGERS IV SOLN
INTRAVENOUS | Status: DC
  Administered 2014-12-12 (×2): via INTRAVENOUS

## 2014-12-12 MED ORDER — OXYCODONE-ACETAMINOPHEN 5-325 MG PO TABS: 1.0000 | ORAL_TABLET | Freq: Four times a day (QID) | ORAL | Status: DC | PRN

## 2014-12-12 MED ORDER — ONDANSETRON HCL 4 MG/2ML IJ SOLN
INTRAMUSCULAR | Status: DC | PRN
  Administered 2014-12-12: 4 mg via INTRAVENOUS

## 2014-12-12 MED ORDER — LIDOCAINE HCL (CARDIAC) 20 MG/ML IV SOLN
INTRAVENOUS | Status: DC | PRN
  Administered 2014-12-12: 60 mg via INTRAVENOUS

## 2014-12-12 MED ORDER — LIDOCAINE HCL (CARDIAC) 20 MG/ML IV SOLN
INTRAVENOUS | Status: AC
  Filled 2014-12-12: qty 5

## 2014-12-12 MED ORDER — MISOPROSTOL 200 MCG PO TABS
ORAL_TABLET | ORAL | Status: AC
  Filled 2014-12-12: qty 5

## 2014-12-12 MED ORDER — HYDROMORPHONE HCL 1 MG/ML IJ SOLN
INTRAMUSCULAR | Status: AC
  Filled 2014-12-12: qty 1

## 2014-12-12 MED ORDER — METOCLOPRAMIDE HCL 5 MG/ML IJ SOLN
INTRAMUSCULAR | Status: AC
  Filled 2014-12-12: qty 2

## 2014-12-12 MED ORDER — HYDROMORPHONE HCL 1 MG/ML IJ SOLN
INTRAMUSCULAR | Status: DC | PRN
  Administered 2014-12-12: .3 mg via INTRAVENOUS
  Administered 2014-12-12 (×2): .2 mg via INTRAVENOUS
  Administered 2014-12-12: .3 mg via INTRAVENOUS

## 2014-12-12 MED ORDER — MEPERIDINE HCL 25 MG/ML IJ SOLN: 6.2500 mg | INTRAMUSCULAR | Status: DC | PRN

## 2014-12-12 MED ORDER — DEXAMETHASONE SODIUM PHOSPHATE 4 MG/ML IJ SOLN
INTRAMUSCULAR | Status: DC | PRN
  Administered 2014-12-12: 5 mg via INTRAVENOUS

## 2014-12-12 MED ORDER — FENTANYL CITRATE (PF) 100 MCG/2ML IJ SOLN
INTRAMUSCULAR | Status: AC
  Filled 2014-12-12: qty 2

## 2014-12-12 MED ORDER — SCOPOLAMINE 1 MG/3DAYS TD PT72
1.0000 | MEDICATED_PATCH | Freq: Once | TRANSDERMAL | Status: DC
  Administered 2014-12-12: 1.5 mg via TRANSDERMAL

## 2014-12-12 SURGICAL SUPPLY — 16 items
ADAPTER VACURETTE TBG SET 14 (CANNULA) ×1 IMPLANT
CLOTH BEACON ORANGE TIMEOUT ST (SAFETY) ×2 IMPLANT
CONT PATH 16OZ SNAP LID 3702 (MISCELLANEOUS) IMPLANT
GLOVE BIO SURGEON STRL SZ 6.5 (GLOVE) ×2 IMPLANT
GOWN STRL REUS W/TWL LRG LVL3 (GOWN DISPOSABLE) ×4 IMPLANT
KIT BERKELEY 2ND TRIMESTER 1/2 (COLLECTOR) ×1 IMPLANT
NS IRRIG 1000ML POUR BTL (IV SOLUTION) ×2 IMPLANT
PACK VAGINAL MINOR WOMEN LF (CUSTOM PROCEDURE TRAY) ×2 IMPLANT
PAD OB MATERNITY 4.3X12.25 (PERSONAL CARE ITEMS) ×2 IMPLANT
PAD PREP 24X48 CUFFED NSTRL (MISCELLANEOUS) ×2 IMPLANT
SET BERKELEY SUCTION TUBING (SUCTIONS) ×1 IMPLANT
TOWEL OR 17X24 6PK STRL BLUE (TOWEL DISPOSABLE) ×4 IMPLANT
TUBE VACURETTE 2ND TRIMESTER (CANNULA) ×1 IMPLANT
VACURETTE 12 RIGID CVD (CANNULA) ×1 IMPLANT
VACURETTE 14MM CVD 1/2 BASE (CANNULA) IMPLANT
VACURETTE 16MM ASPIR CVD .5 (CANNULA) IMPLANT

## 2014-12-12 NOTE — Anesthesia Preprocedure Evaluation (Addendum)
Anesthesia Evaluation   Patient awake    Reviewed: Allergy & Precautions, NPO status , Patient's Chart, lab work & pertinent test results  Airway Mallampati: III  TM Distance: >3 FB Neck ROM: Full    Dental no notable dental hx. (+) Teeth Intact   Pulmonary neg pulmonary ROS,  breath sounds clear to auscultation  Pulmonary exam normal       Cardiovascular negative cardio ROS Normal cardiovascular examRhythm:Regular Rate:Normal     Neuro/Psych negative neurological ROS  negative psych ROS   GI/Hepatic negative GI ROS, Neg liver ROS,   Endo/Other  Morbid obesity  Renal/GU negative Renal ROS  negative genitourinary   Musculoskeletal negative musculoskeletal ROS (+)   Abdominal (+) + obese,   Peds  Hematology negative hematology ROS (+)   Anesthesia Other Findings   Reproductive/Obstetrics (+) Pregnancy IUFD 16+ weeks                            Anesthesia Physical Anesthesia Plan  ASA: III  Anesthesia Plan: General   Post-op Pain Management:    Induction: Intravenous, Rapid sequence and Cricoid pressure planned  Airway Management Planned: Oral ETT  Additional Equipment:   Intra-op Plan:   Post-operative Plan: Extubation in OR  Informed Consent: I have reviewed the patients History and Physical, chart, labs and discussed the procedure including the risks, benefits and alternatives for the proposed anesthesia with the patient or authorized representative who has indicated his/her understanding and acceptance.   Dental advisory given  Plan Discussed with: CRNA, Anesthesiologist and Surgeon  Anesthesia Plan Comments:        Anesthesia Quick Evaluation

## 2014-12-12 NOTE — Discharge Instructions (Signed)
°  Post Anesthesia Home Care Instructions ° °Activity: °Get plenty of rest for the remainder of the day. A responsible adult should stay with you for 24 hours following the procedure.  °For the next 24 hours, DO NOT: °-Drive a car °-Operate machinery °-Drink alcoholic beverages °-Take any medication unless instructed by your physician °-Make any legal decisions or sign important papers. ° °Meals: °Start with liquid foods such as gelatin or soup. Progress to regular foods as tolerated. Avoid greasy, spicy, heavy foods. If nausea and/or vomiting occur, drink only clear liquids until the nausea and/or vomiting subsides. Call your physician if vomiting continues. ° °Special Instructions/Symptoms: °Your throat may feel dry or sore from the anesthesia or the breathing tube placed in your throat during surgery. If this causes discomfort, gargle with warm salt water. The discomfort should disappear within 24 hours. ° °If you had a scopolamine patch placed behind your ear for the management of post- operative nausea and/or vomiting: ° °1. The medication in the patch is effective for 72 hours, after which it should be removed.  Wrap patch in a tissue and discard in the trash. Wash hands thoroughly with soap and water. °2. You may remove the patch earlier than 72 hours if you experience unpleasant side effects which may include dry mouth, dizziness or visual disturbances. °3. Avoid touching the patch. Wash your hands with soap and water after contact with the patch. °  °DISCHARGE INSTRUCTIONS: D&C / D&E °The following instructions have been prepared to help you care for yourself upon your return home. °  °Personal hygiene: °• Use sanitary pads for vaginal drainage, not tampons. °• Shower the day after your procedure. °• NO tub baths, pools or Jacuzzis for 2-3 weeks. °• Wipe front to back after using the bathroom. ° °Activity and limitations: °• Do NOT drive or operate any equipment for 24 hours. The effects of anesthesia are  still present and drowsiness may result. °• Do NOT rest in bed all day. °• Walking is encouraged. °• Walk up and down stairs slowly. °• You may resume your normal activity in one to two days or as indicated by your physician. ° °Sexual activity: NO intercourse for at least 2 weeks after the procedure, or as indicated by your physician. ° °Diet: Eat a light meal as desired this evening. You may resume your usual diet tomorrow. ° °Return to work: You may resume your work activities in one to two days or as indicated by your doctor. ° °What to expect after your surgery: Expect to have vaginal bleeding/discharge for 2-3 days and spotting for up to 10 days. It is not unusual to have soreness for up to 1-2 weeks. You may have a slight burning sensation when you urinate for the first day. Mild cramps may continue for a couple of days. You may have a regular period in 2-6 weeks. ° °Call your doctor for any of the following: °• Excessive vaginal bleeding, saturating and changing one pad every hour. °• Inability to urinate 6 hours after discharge from hospital. °• Pain not relieved by pain medication. °• Fever of 100.4° F or greater. °• Unusual vaginal discharge or odor. ° ° Call for an appointment:  ° ° °Patient’s signature: ______________________ ° °Nurse’s signature ________________________ ° °Support person's signature_______________________ ° ° ° °

## 2014-12-12 NOTE — Brief Op Note (Signed)
12/12/2014  2:19 PM  PATIENT:  Lindsay Horn  29 y.o. female  PRE-OPERATIVE DIAGNOSIS:  missed abortion  POST-OPERATIVE DIAGNOSIS:  missed AB. 2nd trimester  PROCEDURE:  Procedure(s): DILATATION AND EVACUATION (D&E) 2ND TRIMESTER with ultrasound guidance (N/A)  SURGEON:  Surgeon(s) and Role:    * Rigdon Macomber Bovard-Stuckert, MD - Primary  ANESTHESIA:   spinal and paracervical block  EBL:  Total I/O In: 1000 [I.V.:1000] Out: 275 [Urine:100; Blood:175]  BLOOD ADMINISTERED:none  DRAINS: none   LOCAL MEDICATIONS USED:  LIDOCAINE   SPECIMEN:  Source of Specimen:  POC  DISPOSITION OF SPECIMEN:  PATHOLOGY  COUNTS:  YES  TOURNIQUET:  * No tourniquets in log *  DICTATION: .Other Dictation: Dictation Number [id number]  PLAN OF CARE: Discharge to home after PACU  PATIENT DISPOSITION:  PACU - hemodynamically stable.   Delay start of Pharmacological VTE agent (>24hrs) due to surgical blood loss or risk of bleeding: not applicable

## 2014-12-12 NOTE — Interval H&P Note (Signed)
History and Physical Interval Note:  12/12/2014 12:51 PM  Lindsay Horn  has presented today for surgery, with the diagnosis of missed abortion  The various methods of treatment have been discussed with the patient and family. After consideration of risks, benefits and other options for treatment, the patient has consented to  Procedure(s): DILATATION AND EVACUATION (D&E) 2ND TRIMESTER with ultrasound guidance (N/A) as a surgical intervention .  The patient's history has been reviewed, patient examined, no change in status, stable for surgery.  I have reviewed the patient's chart and labs.  Questions were answered to the patient's satisfaction.     Bovard-Stuckert, Kharisma Glasner

## 2014-12-12 NOTE — Anesthesia Procedure Notes (Signed)
Procedure Name: Intubation Date/Time: 12/12/2014 1:34 PM Performed by: Flossie Dibble Pre-anesthesia Checklist: Emergency Drugs available, Timeout performed, Suction available, Patient identified and Patient being monitored Patient Re-evaluated:Patient Re-evaluated prior to inductionOxygen Delivery Method: Circle system utilized Preoxygenation: Pre-oxygenation with 100% oxygen Intubation Type: Rapid sequence, Cricoid Pressure applied and IV induction Grade View: Grade I Tube size: 7.0 mm Number of attempts: 1 Airway Equipment and Method: Patient positioned with wedge pillow and Stylet Placement Confirmation: ETT inserted through vocal cords under direct vision,  breath sounds checked- equal and bilateral and positive ETCO2 Secured at: 21 cm Tube secured with: Tape Dental Injury: Teeth and Oropharynx as per pre-operative assessment

## 2014-12-12 NOTE — Transfer of Care (Signed)
Immediate Anesthesia Transfer of Care Note  Patient: Lindsay Horn  Procedure(s) Performed: Procedure(s): DILATATION AND EVACUATION (D&E) 2ND TRIMESTER with ultrasound guidance (N/A)  Patient Location: PACU  Anesthesia Type:General  Level of Consciousness: awake, alert  and oriented  Airway & Oxygen Therapy: Patient Spontanous Breathing and Patient connected to nasal cannula oxygen  Post-op Assessment: Report given to RN and Post -op Vital signs reviewed and stable  Post vital signs: Reviewed and stable  Last Vitals:  Filed Vitals:   12/12/14 1217  BP: 140/81  Pulse: 95  Temp: 37.2 C  Resp: 16    Complications: No apparent anesthesia complications

## 2014-12-12 NOTE — Anesthesia Postprocedure Evaluation (Signed)
  Anesthesia Post-op Note  Patient: Lindsay Horn  Procedure(s) Performed: Procedure(s): DILATATION AND EVACUATION (D&E) 2ND TRIMESTER with ultrasound guidance (N/A)  Patient Location: PACU  Anesthesia Type:General  Level of Consciousness: awake, alert  and oriented  Airway and Oxygen Therapy: Patient Spontanous Breathing  Post-op Pain: none  Post-op Assessment: Post-op Vital signs reviewed, Patient's Cardiovascular Status Stable, Respiratory Function Stable, Patent Airway, No signs of Nausea or vomiting and Pain level controlled  Post-op Vital Signs: Reviewed and stable  Last Vitals:  Filed Vitals:   12/12/14 1515  BP:   Pulse: 93  Temp:   Resp: 23    Complications: No apparent anesthesia complications

## 2014-12-12 NOTE — Interval H&P Note (Signed)
History and Physical Interval Note:  12/12/2014 12:51 PM  Lindsay Horn  has presented today for surgery, with the diagnosis of missed abortion  The various methods of treatment have been discussed with the patient and family. After consideration of risks, benefits and other options for treatment, the patient has consented to  Procedure(s): DILATATION AND EVACUATION (D&E) 2ND TRIMESTER with ultrasound guidance (N/A) as a surgical intervention .  The patient's history has been reviewed, patient examined, no change in status, stable for surgery.  I have reviewed the patient's chart and labs.  Questions were answered to the patient's satisfaction.     Bovard-Stuckert, Biance Moncrief

## 2014-12-12 NOTE — OR Nursing (Signed)
U/S called at 1300 for 1320 case. U/S in room at 1330

## 2014-12-13 ENCOUNTER — Encounter (HOSPITAL_COMMUNITY): Payer: Self-pay | Admitting: Obstetrics and Gynecology

## 2014-12-13 NOTE — Op Note (Signed)
NAMEJAN, Lindsay Horn NO.:  [id number]  MEDICAL RECORD NO.:  [id number]  LOCATION:  [facility]                          FACILITY:  [facility]  PHYSICIAN:  Thornell Sartorius, MD        DATE OF BIRTH:  1985/10/13  DATE OF PROCEDURE:  12/12/2014 DATE OF DISCHARGE:  12/12/2014                              OPERATIVE REPORT   PREOPERATIVE DIAGNOSIS:  Missed abortion at 16 weeks, measuring 13 weeks.  POSTOPERATIVE DIAGNOSIS:  Missed abortion at 16 weeks, measuring 13 weeks.  PROCEDURE:  Dilatation and evacuation with ultrasound guidance.  SURGEON:  Thornell Sartorius, MD  ANESTHESIA:  Spinal and 10 mL paracervical block.  IV FLUIDS:  1000 mL.  URINE OUTPUT:  100 mL.  EBL:  Approximately 175 mL.  Lidocaine used for paracervical block, 10 mL, 1%.  SPECIMEN:  Products of conception to Path.  COMPLICATIONS:  None.  DESCRIPTION OF PROCEDURE:  After informed consent was reviewed with the patient, she was transported to the operating room in stable condition. She was placed on the table in supine position.  General anesthesia was induced and found to be adequate.  She was then placed in the [place] stirrups, prepped and draped in the normal sterile fashion.  Using an open-sided speculum, her cervix was easily visualized.  A paracervical block was placed and her cervix was dilated to accommodate a 12-French curette.  In act of dilatation, the pregnancy delivered itself.  The placenta was left in situ after delivery of the infant; therefore, curettage with a suction curettage was performed with several passes under direct visualization with the ultrasound guidance yielding tissue. The ultrasound revealed clean, thin endometrial stripe at the end of the procedure.  The patient tolerated the procedure well.  Sponge, lap, and needle counts were correct x2. The tenaculum was removed from her cervix.  The bleeding was thought to be within normal limits.  She was returned to the supine  position, awakened in stable condition, transferred to the PACU.     Thornell Sartorius, MD     JB/MEDQ  D:  12/12/2014  T:  12/13/2014  Job:  [id number]

## 2014-12-31 LAB — CHROMOSOME STD, POC(TISSUE)-NCBH

## 2015-07-20 ENCOUNTER — Inpatient Hospital Stay (HOSPITAL_COMMUNITY)
Admission: AD | Admit: 2015-07-20 | Discharge: 2015-07-20 | Disposition: A | Discharge status: Home / Self Care | Payer: BC Managed Care – PPO | Source: Ambulatory Visit | Attending: Obstetrics and Gynecology | Admitting: Obstetrics and Gynecology

## 2015-07-20 ENCOUNTER — Encounter (HOSPITAL_COMMUNITY): Payer: Self-pay | Admitting: *Deleted

## 2015-07-20 DIAGNOSIS — Z3491 Encounter for supervision of normal pregnancy, unspecified, first trimester: Secondary | ICD-10-CM | POA: Diagnosis not present

## 2015-07-20 LAB — CBC WITH DIFFERENTIAL/PLATELET
Basophils Absolute: 0.1 10*3/uL (ref 0.0–0.1)
Basophils Relative: 1 %
EOS ABS: 0.1 10*3/uL (ref 0.0–0.7)
Eosinophils Relative: 1 %
HEMATOCRIT: 36.4 % (ref 36.0–46.0)
HEMOGLOBIN: 11.4 g/dL — AB (ref 12.0–15.0)
LYMPHS ABS: 1.9 10*3/uL (ref 0.7–4.0)
Lymphocytes Relative: 35 %
MCH: 25.6 pg — AB (ref 26.0–34.0)
MCHC: 31.3 g/dL (ref 30.0–36.0)
MCV: 81.8 fL (ref 78.0–100.0)
MONOS PCT: 3 %
Monocytes Absolute: 0.2 10*3/uL (ref 0.1–1.0)
NEUTROS ABS: 3.1 10*3/uL (ref 1.7–7.7)
Neutrophils Relative %: 60 %
Platelets: 247 10*3/uL (ref 150–400)
RBC: 4.45 MIL/uL (ref 3.87–5.11)
RDW: 14.7 % (ref 11.5–15.5)
WBC: 5.3 10*3/uL (ref 4.0–10.5)

## 2015-07-20 LAB — BUN: BUN: 15 mg/dL (ref 6–20)

## 2015-07-20 LAB — AST: AST: 27 U/L (ref 15–41)

## 2015-07-20 LAB — TYPE AND SCREEN
ABO/RH(D): A POS
Antibody Screen: NEGATIVE

## 2015-07-20 LAB — ABO/RH: ABO/RH(D): A POS

## 2015-07-20 LAB — CREATININE, SERUM
CREATININE: 0.65 mg/dL (ref 0.44–1.00)
GFR calc Af Amer: >60 mL/min (ref >60)
GFR calc non Af Amer: >60 mL/min (ref >60)

## 2015-07-20 LAB — HCG, QUANTITATIVE, PREGNANCY: hCG, Beta Chain, Quant, S: 4134 m[IU]/mL — ABNORMAL HIGH (ref <5)

## 2015-07-20 MED ORDER — METHOTREXATE INJECTION FOR WOMEN'S HOSPITAL
50.0000 mg/m2 | Freq: Once | INTRAMUSCULAR | Status: AC
  Administered 2015-07-20: 120 mg via INTRAMUSCULAR
  Filled 2015-07-20: qty 2.4

## 2015-07-20 NOTE — MAU Note (Signed)
MTX education sheet given to pt.

## 2015-07-20 NOTE — MAU Note (Signed)
Dr. Melba Coon here to discuss plan of care with pt.

## 2015-07-20 NOTE — Progress Notes (Signed)
  Pt with inappropriately rising quant.  [place] no IUP and no obvious ectopic.    Labs reviewed.  D/W pt MTX for nonviable preg vs ectopic.   Pt's labs OK for MTx, will give meds  F/U day 4&&

## 2015-07-20 NOTE — Discharge Instructions (Signed)
Methotrexate Treatment for an Ectopic Pregnancy °Methotrexate is a medicine that treats ectopic pregnancy by stopping the growth of the fertilized egg. It also helps your body absorb tissue from the egg. This takes between 2 weeks and 6 weeks. Most ectopic pregnancies can be successfully treated with methotrexate if they are detected early enough. °LET YOUR HEALTH CARE PROVIDER KNOW ABOUT: °· Any allergies you have. °· All medicines you are taking, including vitamins, herbs, eye drops, creams, and over-the-counter medicines. °· Medical conditions you have. °RISKS AND COMPLICATIONS °Generally, this is a safe treatment. However, as with any treatment, problems can occur. Possible problems or side effects include: °· Nausea. °· Vomiting. °· Diarrhea. °· Abdominal cramping. °· Mouth sores. °· Increased vaginal bleeding or spotting.   °· Swelling or irritation of the lining of your lungs (pneumonitis).  °· Failed treatment and continuation of the pregnancy.   °· Liver damage. °· Hair loss. °There is still a risk of the ectopic pregnancy rupturing while using the methotrexate. °BEFORE THE PROCEDURE °Before you take the medicine:  °· Liver tests, kidney tests, and a complete blood test are performed. °· Blood tests are performed to measure the pregnancy hormone levels and to determine your blood type. °· If you are Rh-negative and the father is Rh-positive or his Rh type is not known, you will be given a Rho (D) immune globulin shot. °PROCEDURE  °There are two methods that your health care provider may use to prescribe methotrexate. One method involves a single dose or injection of the medicine. Another method involves a series of doses given through several injections.  °AFTER THE PROCEDURE °· You may have some abdominal cramping, vaginal bleeding, and fatigue in the first few days after taking methotrexate. °· Blood tests will be taken for several weeks to check the pregnancy hormone levels. The blood tests are performed  until there is no more pregnancy hormone detected in the blood. °  °This information is not intended to replace advice given to you by your health care provider. Make sure you discuss any questions you have with your health care provider. °  °Document Released: 06/30/2001 Document Revised: 07/27/2014 Document Reviewed: 04/24/2013 °Elsevier Interactive Patient Education ©2016 [employer] ° °

## 2015-07-20 NOTE — MAU Note (Signed)
Pt here for MTX - has known ectopic pregnancy.  Denies pain or bleeding.

## 2015-07-23 ENCOUNTER — Inpatient Hospital Stay (HOSPITAL_COMMUNITY)
Admission: AD | Admit: 2015-07-23 | Discharge: 2015-07-23 | Disposition: A | Discharge status: Home / Self Care | Payer: BC Managed Care – PPO | Source: Ambulatory Visit | Attending: Obstetrics and Gynecology | Admitting: Obstetrics and Gynecology

## 2015-07-23 DIAGNOSIS — O009 Unspecified ectopic pregnancy without intrauterine pregnancy: Secondary | ICD-10-CM | POA: Diagnosis present

## 2015-07-23 DIAGNOSIS — Z3A08 8 weeks gestation of pregnancy: Secondary | ICD-10-CM | POA: Diagnosis not present

## 2015-07-23 LAB — HCG, QUANTITATIVE, PREGNANCY: hCG, Beta Chain, Quant, S: 4416 m[IU]/mL — ABNORMAL HIGH (ref <5)

## 2015-07-23 NOTE — MAU Provider Note (Signed)
History   [id number]   Chief Complaint  Patient presents with  . Follow-up    HPI Lindsay Horn is a 30 y.o. female G2P0000 here for follow-up BHCG.  Upon review of the records patient was first seen on 12/31 sent from office for MTX administration d/t inappropriately rising BHCGs. BHCG on that day was 4134.  Ultrasound showed in office showed no IUP. Patient is here today for day 4 labs s/p MTX. Denies abdominal pain or vaginal bleeding.  [facility] media tab.   Patient's last menstrual period was 05/22/2015.  OB History  Gravida Para Term Preterm AB SAB TAB Ectopic Multiple Living  2 1 0 0 0 0 0 0  0    # Outcome Date GA Lbr Len/2nd Weight Sex Delivery Anes PTL Lv  2 Current           1 Para         FD      Past Medical History  Diagnosis Date  . Fetal demise, less than 22 weeks 12/11/2014  . Medical history non-contributory     No family history on file.  Social History   Social History  . Marital Status: Married    Spouse Name: N/A  . Number of Children: N/A  . Years of Education: N/A   Social History Main Topics  . Smoking status: Never Smoker   . Smokeless tobacco: Not on file  . Alcohol Use: 2.4 oz/week    4 Cans of beer per week  . Drug Use: No  . Sexual Activity: Yes   Other Topics Concern  . Not on file   Social History Narrative   ** Merged History Encounter **        Allergies  Allergen Reactions  . Codeine Hives    No current facility-administered medications on file prior to encounter.   Current Outpatient Prescriptions on File Prior to Encounter  Medication Sig Dispense Refill  . acetaminophen (TYLENOL) 325 MG tablet Take 650 mg by mouth every 6 (six) hours as needed for mild pain.    . cetirizine (ZYRTEC) 10 MG tablet Take 10 mg by mouth daily as needed for allergies.    Marland Kitchen ibuprofen (ADVIL,MOTRIN) 100 MG tablet Take 100 mg by mouth every 6 (six) hours as needed for fever.    Marland Kitchen  ibuprofen (ADVIL,MOTRIN) 800 MG tablet Take 1 tablet (800 mg total) by mouth every 8 (eight) hours as needed for moderate pain. 30 tablet 1  . meloxicam (MOBIC) 15 MG tablet Take 1 tablet (15 mg total) by mouth daily. 30 tablet 1  . Multiple Vitamin (MULTIVITAMIN) capsule Take 1 capsule by mouth daily.    Marland Kitchen oxyCODONE-acetaminophen (ROXICET) 5-325 MG per tablet Take 1-2 tablets by mouth every 6 (six) hours as needed for severe pain. 15 tablet 0  . Prenatal Vit-Min-FA-Fish Oil ([employer] PRENATAL GUMMY PO) Take 2 tablets by mouth daily.       Physical Exam   Filed Vitals:   07/23/15 1625  BP: 115/67  Pulse: 76  Temp: 98.3 F (36.8 C)  TempSrc: Oral  Resp: 18    Physical Exam  Nursing note and vitals reviewed. Constitutional: She is oriented to person, place, and time. She appears well-developed and well-nourished. No distress.  HENT:  Head: Normocephalic and atraumatic.  Cardiovascular: Normal rate.   Respiratory: Effort normal. No respiratory distress.  Musculoskeletal: Normal range of motion.  Neurological: She is alert and oriented to person,  place, and time.  Skin: She is not diaphoretic.  Psychiatric: She has a normal mood and affect. Her behavior is normal. Judgment and thought content normal.    MAU Course  Procedures Component     Latest Ref Rng 07/20/2015 07/23/2015  HCG, Beta Chain, Quant, S     <5 mIU/mL 4134 (H) 4416 (H)   MDM Pt denies abdominal pain or vaginal bleeding BHCG stable 1733- S/w Dr. Terri Piedra. Ok to discharge home.  Assessment and Plan  30 y.o. G2P0000 at [redacted]w[redacted]d wks Pregnancy Follow-up BHCG Ectopic pregnancy s/p methotrexate  P: Discharge home Return on Friday for day 7 labs Reviewed ruptured ectopic s/s & when to return  Jorje Guild, NP 07/23/2015 4:29 PM

## 2015-07-23 NOTE — MAU Note (Signed)
Feeling good, maybe 2 cramps since iinjection- no bleeding.

## 2015-07-23 NOTE — Discharge Instructions (Signed)
Methotrexate Treatment for an Ectopic Pregnancy °Methotrexate is a medicine that treats ectopic pregnancy by stopping the growth of the fertilized egg. It also helps your body absorb tissue from the egg. This takes between 2 weeks and 6 weeks. Most ectopic pregnancies can be successfully treated with methotrexate if they are detected early enough. °LET YOUR HEALTH CARE PROVIDER KNOW ABOUT: °· Any allergies you have. °· All medicines you are taking, including vitamins, herbs, eye drops, creams, and over-the-counter medicines. °· Medical conditions you have. °RISKS AND COMPLICATIONS °Generally, this is a safe treatment. However, as with any treatment, problems can occur. Possible problems or side effects include: °· Nausea. °· Vomiting. °· Diarrhea. °· Abdominal cramping. °· Mouth sores. °· Increased vaginal bleeding or spotting.   °· Swelling or irritation of the lining of your lungs (pneumonitis).  °· Failed treatment and continuation of the pregnancy.   °· Liver damage. °· Hair loss. °There is still a risk of the ectopic pregnancy rupturing while using the methotrexate. °BEFORE THE PROCEDURE °Before you take the medicine:  °· Liver tests, kidney tests, and a complete blood test are performed. °· Blood tests are performed to measure the pregnancy hormone levels and to determine your blood type. °· If you are Rh-negative and the father is Rh-positive or his Rh type is not known, you will be given a Rho (D) immune globulin shot. °PROCEDURE  °There are two methods that your health care provider may use to prescribe methotrexate. One method involves a single dose or injection of the medicine. Another method involves a series of doses given through several injections.  °AFTER THE PROCEDURE °· You may have some abdominal cramping, vaginal bleeding, and fatigue in the first few days after taking methotrexate. °· Blood tests will be taken for several weeks to check the pregnancy hormone levels. The blood tests are performed  until there is no more pregnancy hormone detected in the blood. °  °This information is not intended to replace advice given to you by your health care provider. Make sure you discuss any questions you have with your health care provider. °  °Document Released: 06/30/2001 Document Revised: 07/27/2014 Document Reviewed: 04/24/2013 °Elsevier Interactive Patient Education ©2016 Elsevier Inc. ° °Ectopic Pregnancy °An ectopic pregnancy is when the fertilized egg attaches (implants) outside the uterus. Most ectopic pregnancies occur in the fallopian tube. Rarely do ectopic pregnancies occur on the ovary, intestine, pelvis, or cervix. In an ectopic pregnancy, the fertilized egg does not have the ability to develop into a normal, healthy baby.  °A ruptured ectopic pregnancy is one in which the fallopian tube gets torn or bursts and results in internal bleeding. Often there is intense abdominal pain, and sometimes, vaginal bleeding. Having an ectopic pregnancy can be life threatening. If left untreated, this dangerous condition can lead to a blood transfusion, abdominal surgery, or even death. °CAUSES  °Damage to the fallopian tubes is the suspected cause in most ectopic pregnancies.  °RISK FACTORS °Depending on your circumstances, the risk of having an ectopic pregnancy will vary. The level of risk can be divided into three categories. °High Risk °· You have gone through infertility treatment. °· You have had a previous ectopic pregnancy. °· You have had previous tubal surgery. °· You have had previous surgery to have the fallopian tubes tied (tubal ligation). °· You have tubal problems or diseases. °· You have been exposed to DES. DES is a medicine that was used until 1971 and had effects on babies whose mothers took the medicine. °·   You become pregnant while using an intrauterine device (IUD) for birth control.  °Moderate Risk °· You have a history of infertility. °· You have a history of a sexually transmitted infection  (STI). °· You have a history of pelvic inflammatory disease (PID). °· You have scarring from endometriosis. °· You have multiple sexual partners. °· You smoke.  °Low Risk °· You have had previous pelvic surgery. °· You use vaginal douching. °· You became sexually active before 28 years of age. °SIGNS AND SYMPTOMS  °An ectopic pregnancy should be suspected in anyone who has missed a period and has abdominal pain or bleeding. °· You may experience normal pregnancy symptoms, such as: °¨ Nausea. °¨ Tiredness. °¨ Breast tenderness. °· Other symptoms may include: °¨ Pain with intercourse. °¨ Irregular vaginal bleeding or spotting. °¨ Cramping or pain on one side or in the lower abdomen. °¨ Fast heartbeat. °¨ Passing out while having a bowel movement. °· Symptoms of a ruptured ectopic pregnancy and internal bleeding may include: °¨ Sudden, severe pain in the abdomen and pelvis. °¨ Dizziness or fainting. °¨ Pain in the shoulder area. °DIAGNOSIS  °Tests that may be performed include: °· A pregnancy test. °· An ultrasound test. °· Testing the specific level of pregnancy hormone in the bloodstream. °· Taking a sample of uterus tissue (dilation and curettage, D&C). °· Surgery to perform a visual exam of the inside of the abdomen using a thin, lighted tube with a tiny camera on the end (laparoscope). °TREATMENT  °An injection of a medicine called methotrexate may be given. This medicine causes the pregnancy tissue to be absorbed. It is given if: °· The diagnosis is made early. °· The fallopian tube has not ruptured. °· You are considered to be a good candidate for the medicine. °Usually, pregnancy hormone blood levels are checked after methotrexate treatment. This is to be sure the medicine is effective. It may take 4-6 weeks for the pregnancy to be absorbed (though most pregnancies will be absorbed by 3 weeks). °Surgical treatment may be needed. A laparoscope may be used to remove the pregnancy tissue. If severe internal  bleeding occurs, a cut (incision) may be made in the lower abdomen (laparotomy), and the ectopic pregnancy is removed. This stops the bleeding. Part of the fallopian tube, or the whole tube, may be removed as well (salpingectomy). After surgery, pregnancy hormone tests may be done to be sure there is no pregnancy tissue left. You may receive a Rho (D) immune globulin shot if you are Rh negative and the father is Rh positive, or if you do not know the Rh type of the father. This is to prevent problems with any future pregnancy. °SEEK IMMEDIATE MEDICAL CARE IF:  °You have any symptoms of an ectopic pregnancy. This is a medical emergency. °MAKE SURE YOU: °· Understand these instructions. °· Will watch your condition. °· Will get help right away if you are not doing well or get worse. °  °This information is not intended to replace advice given to you by your health care provider. Make sure you discuss any questions you have with your health care provider. °  °Document Released: 08/13/2004 Document Revised: 07/27/2014 Document Reviewed: 02/02/2013 °Elsevier Interactive Patient Education ©2016 Elsevier Inc. ° °

## 2015-07-26 ENCOUNTER — Inpatient Hospital Stay (HOSPITAL_COMMUNITY)
Admission: AD | Admit: 2015-07-26 | Discharge: 2015-07-26 | Disposition: A | Discharge status: Home / Self Care | Payer: BC Managed Care – PPO | Source: Ambulatory Visit | Attending: Obstetrics and Gynecology | Admitting: Obstetrics and Gynecology

## 2015-07-26 DIAGNOSIS — O009 Unspecified ectopic pregnancy without intrauterine pregnancy: Secondary | ICD-10-CM

## 2015-07-26 DIAGNOSIS — Z3A09 9 weeks gestation of pregnancy: Secondary | ICD-10-CM | POA: Diagnosis not present

## 2015-07-26 DIAGNOSIS — O3680X0 Pregnancy with inconclusive fetal viability, not applicable or unspecified: Secondary | ICD-10-CM | POA: Insufficient documentation

## 2015-07-26 DIAGNOSIS — O0911 Supervision of pregnancy with history of ectopic or molar pregnancy, first trimester: Secondary | ICD-10-CM | POA: Diagnosis present

## 2015-07-26 DIAGNOSIS — Z885 Allergy status to narcotic agent status: Secondary | ICD-10-CM | POA: Diagnosis not present

## 2015-07-26 LAB — HCG, QUANTITATIVE, PREGNANCY: HCG, BETA CHAIN, QUANT, S: 3722 m[IU]/mL — AB (ref <5)

## 2015-07-26 NOTE — Discharge Instructions (Signed)
Ectopic Pregnancy An ectopic pregnancy happens when a fertilized egg grows outside the uterus. A pregnancy cannot live outside of the uterus. This problem often happens in the fallopian tube. It is often caused by damage to the fallopian tube. If this problem is found early, you may be treated with medicine. If your tube tears or bursts open (ruptures), you will bleed inside. This is an emergency. You will need surgery. Get help right away.  SYMPTOMS You may have normal pregnancy symptoms at first. These include:  Missing your period.  Feeling sick to your stomach (nauseous).  Being tired.  Having tender breasts. Then, you may start to have symptoms that are not normal. These include:  Pain with sex (intercourse).  Bleeding from the vagina. This includes light bleeding (spotting).  Belly (abdomen) or lower belly cramping or pain. This may be felt on one side.  A fast heartbeat (pulse).  Passing out (fainting) after going poop (bowel movement). If your tube tears, you may have symptoms such as:  Really bad pain in the belly or lower belly. This happens suddenly.  Dizziness.  Passing out.  Shoulder pain. GET HELP RIGHT AWAY IF:  You have any of these symptoms. This is an emergency. MAKE SURE YOU:  Understand these instructions.  Will watch your condition.  Will get help right away if you are not doing well or get worse.   This information is not intended to replace advice given to you by your health care provider. Make sure you discuss any questions you have with your health care provider.   Document Released: 10/02/2008 Document Revised: 07/11/2013 Document Reviewed: 02/15/2013 Elsevier Interactive Patient Education 2016 [place].  Methotrexate Treatment for an Ectopic Pregnancy, Care After Refer to this sheet in the next few weeks. These instructions provide you with information on caring for yourself after your procedure. Your health care provider may also give  you more specific instructions. Your treatment has been planned according to current medical practices, but problems sometimes occur. Call your health care provider if you have any problems or questions after your procedure. WHAT TO EXPECT AFTER THE PROCEDURE You may have some abdominal cramping, vaginal bleeding, and fatigue in the first few days after taking methotrexate. Some other possible side effects of methotrexate include:  Nausea.  Vomiting.  Diarrhea.  Mouth sores.  Swelling or irritation of the lining of your lungs (pneumonitis).  Liver damage.  Hair loss. HOME CARE INSTRUCTIONS  After you have received the methotrexate medicine, you need to be careful of your activities and watch your condition for several weeks. It may take 1 week before your hormone levels return to normal.  Keep all follow-up appointments as directed by your health care provider.  Avoid traveling too far away from your health care provider.  Do not have sexual intercourse until your health care provider says it is safe to do so.  You may resume your usual diet.  Limit strenuous activity.  Do not take folic acid, prenatal vitamins, or other vitamins that contain folic acid.  Do not take aspirin, ibuprofen, or naproxen (nonsteroidal anti-inflammatory drugs [NSAIDs]).  Do not drink alcohol. SEEK MEDICAL CARE IF:   You cannot control your nausea and vomiting.  You cannot control your diarrhea.  You have sores in your mouth and want treatment.  You need pain medicine for your abdominal pain.  You have a rash.  You are having a reaction to the medicine. SEEK IMMEDIATE MEDICAL CARE IF:   You have  increasing abdominal or pelvic pain.  You notice increased bleeding.  You feel light-headed, or you faint.  You have shortness of breath.  Your heart rate increases.  You have a cough.  You have chills.  You have a fever.   This information is not intended to replace advice given to  you by your health care provider. Make sure you discuss any questions you have with your health care provider.   Document Released: 06/25/2011 Document Revised: 07/11/2013 Document Reviewed: 04/24/2013 Elsevier Interactive Patient Education [employer].

## 2015-07-26 NOTE — MAU Provider Note (Signed)
  History     CSN: [id number]  Arrival date and time: 07/26/15 1505   None     No chief complaint on file.  HPI Lindsay Horn is 30 y.o. G2P0000 [redacted]w[redacted]d weeks presenting for labs on Day 7 of MTX.  She is a patient of Dr. Roe Rutherford.  Hx of vaginal bleeding in early pregnancy BHCGs followed in office, U/S last Friday in office did not see IUP or adnexal mass.  She began MTX treatment on 07/20/15 for pregnancy of unknown location.  Denies pain or bleeding today.  "sore" in shoulders, denies cramping.     Past Medical History  Diagnosis Date  . Fetal demise, less than 22 weeks 12/11/2014  . Medical history non-contributory     Past Surgical History  Procedure Laterality Date  . Appendectomy    . Appendectomy  2003  . Wisdom tooth extraction  2003  . Dilation and evacuation N/A 12/12/2014    Procedure: DILATATION AND EVACUATION (D&E) 2ND TRIMESTER with ultrasound guidance;  Surgeon: Janyth Contes, MD;  Location: [facility];  Service: Gynecology;  Laterality: N/A;    No family history on file.  Social History  Substance Use Topics  . Smoking status: Never Smoker   . Smokeless tobacco: Not on file  . Alcohol Use: 2.4 oz/week    4 Cans of beer per week    Allergies:  Allergies  Allergen Reactions  . Codeine Hives    Prescriptions prior to admission  Medication Sig Dispense Refill Last Dose  . acetaminophen (TYLENOL) 325 MG tablet Take 650 mg by mouth every 6 (six) hours as needed for mild pain.   12/10/2014  . cetirizine (ZYRTEC) 10 MG tablet Take 10 mg by mouth daily as needed for allergies.   12/11/2014 at Unknown time  . Multiple Vitamin (MULTIVITAMIN) capsule Take 1 capsule by mouth daily.   Taking    Review of Systems  Gastrointestinal: Negative for abdominal pain.  Genitourinary:       Neg for vaginal bleeding  Musculoskeletal:       Sore shoulder  Neurological: Positive for headaches.   Physical Exam   Blood pressure 115/59, pulse 85, temperature 98.4 F (36.9  C), temperature source Oral, resp. rate 16, last menstrual period 05/22/2015.  Physical Exam  Constitutional: She appears well-developed and well-nourished. No distress.  HENT:  Head: Normocephalic.  Neck: Normal range of motion.  GI: There is no tenderness.  Genitourinary:  Exam not indicated  Psychiatric: She has a normal mood and affect. Her behavior is normal. Thought content normal.   Results for SAFIATOU, BARRITT (MRN [id number]) as of 07/26/2015 16:35  Ref. Range 07/20/2015 08:00 07/20/2015 08:00 07/23/2015 16:14 07/26/2015 15:20  HCG, Beta Chain, Quant, S Latest Ref Range: <5 mIU/mL 4134 (H)  4416 (H) 3722 (H)    MAU Course  Procedures  MDM  [facility] Lab Care turned over to [facility], PA-C KEY,EVE M 07/26/2015, 3:21 PM   Discussed lab results and patient presentation with Dr. Willis Modena. He agrees that patient can follow-up in the office in 1 week.   Assessment and Plan  A:   Follow up day 7 of MTX for Pregnancy of Unknown location  P: Discharge home Ectopic precautions discussed Patient advised to follow-up with Dr. Melba Coon in 1 week Patient may return to MAU as needed or if her condition were to change or worsen  Luvenia Redden, PA-C  07/26/2015 4:36 PM

## 2015-07-26 NOTE — MAU Note (Signed)
Patient here BHCG s/p Methotrexate, denies cramping, no vaginal bleeding.

## 2015-09-25 ENCOUNTER — Encounter (HOSPITAL_COMMUNITY): Payer: Self-pay | Admitting: Obstetrics and Gynecology

## 2015-09-25 DIAGNOSIS — N946 Dysmenorrhea, unspecified: Secondary | ICD-10-CM

## 2015-09-25 DIAGNOSIS — Q519 Congenital malformation of uterus and cervix, unspecified: Secondary | ICD-10-CM

## 2015-09-25 HISTORY — DX: Congenital malformation of uterus and cervix, unspecified: Q51.9

## 2015-10-25 ENCOUNTER — Encounter (HOSPITAL_COMMUNITY): Admission: RE | Payer: Self-pay | Source: Ambulatory Visit

## 2015-10-25 ENCOUNTER — Ambulatory Visit (HOSPITAL_COMMUNITY)
Admission: RE | Admit: 2015-10-25 | Payer: BC Managed Care – PPO | Source: Ambulatory Visit | Admitting: Obstetrics and Gynecology

## 2015-10-25 HISTORY — DX: Congenital malformation of uterus and cervix, unspecified: Q51.9

## 2015-10-25 SURGERY — LAPAROSCOPY, DIAGNOSTIC
Anesthesia: General

## 2016-05-04 DIAGNOSIS — Z3490 Encounter for supervision of normal pregnancy, unspecified, unspecified trimester: Secondary | ICD-10-CM | POA: Insufficient documentation

## 2016-05-04 LAB — OB RESULTS CONSOLE RPR: RPR: NONREACTIVE

## 2016-05-04 LAB — OB RESULTS CONSOLE HIV ANTIBODY (ROUTINE TESTING): HIV: NONREACTIVE

## 2016-05-04 LAB — OB RESULTS CONSOLE ABO/RH: RH TYPE: POSITIVE

## 2016-05-04 LAB — OB RESULTS CONSOLE HEPATITIS B SURFACE ANTIGEN: Hepatitis B Surface Ag: NEGATIVE

## 2016-05-04 LAB — OB RESULTS CONSOLE RUBELLA ANTIBODY, IGM: Rubella: IMMUNE

## 2016-05-04 LAB — OB RESULTS CONSOLE GC/CHLAMYDIA
CHLAMYDIA, DNA PROBE: NEGATIVE
GC PROBE AMP, GENITAL: NEGATIVE

## 2016-05-18 IMAGING — CR DG HAND COMPLETE 3+V*L*
3 series · 3 of 3 positions shown · non-contrast
Comparison: None

CLINICAL DATA: LEFT hand injury yesterday, sprain, initial
encounter, thumb injury

EXAM:
LEFT HAND - COMPLETE 3+ VIEW

[PA]
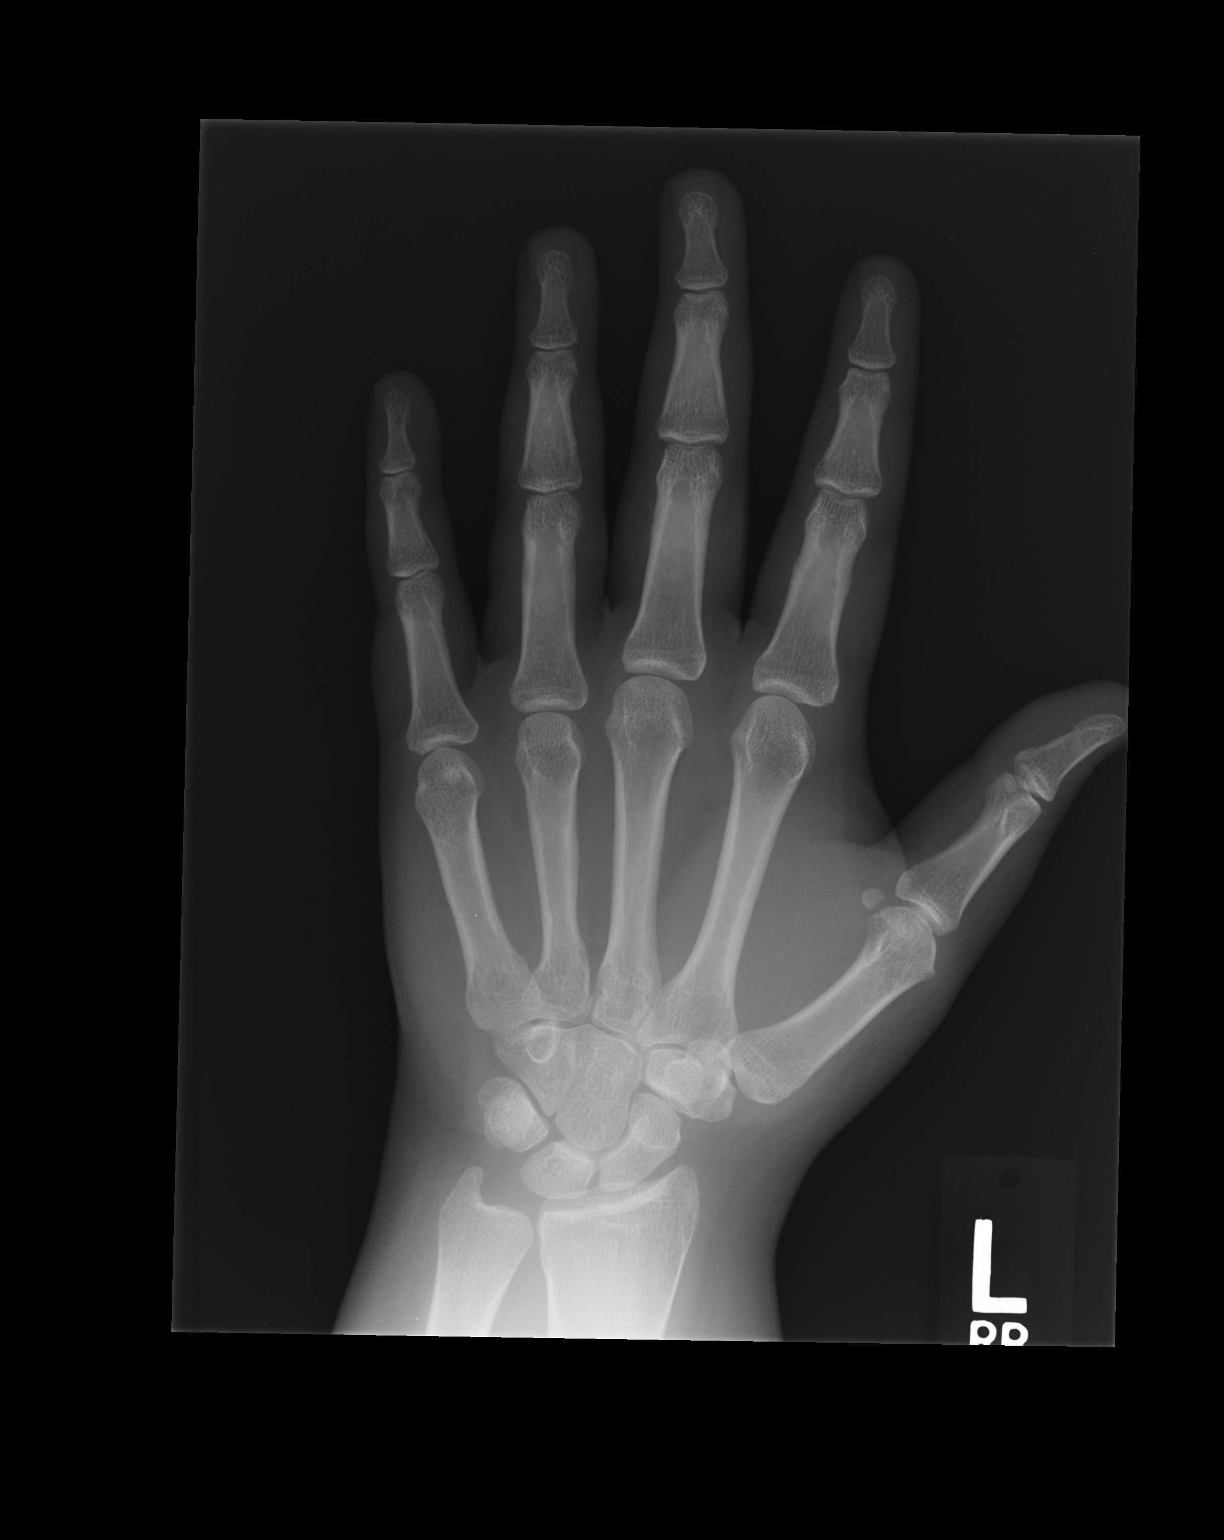

[lateral]
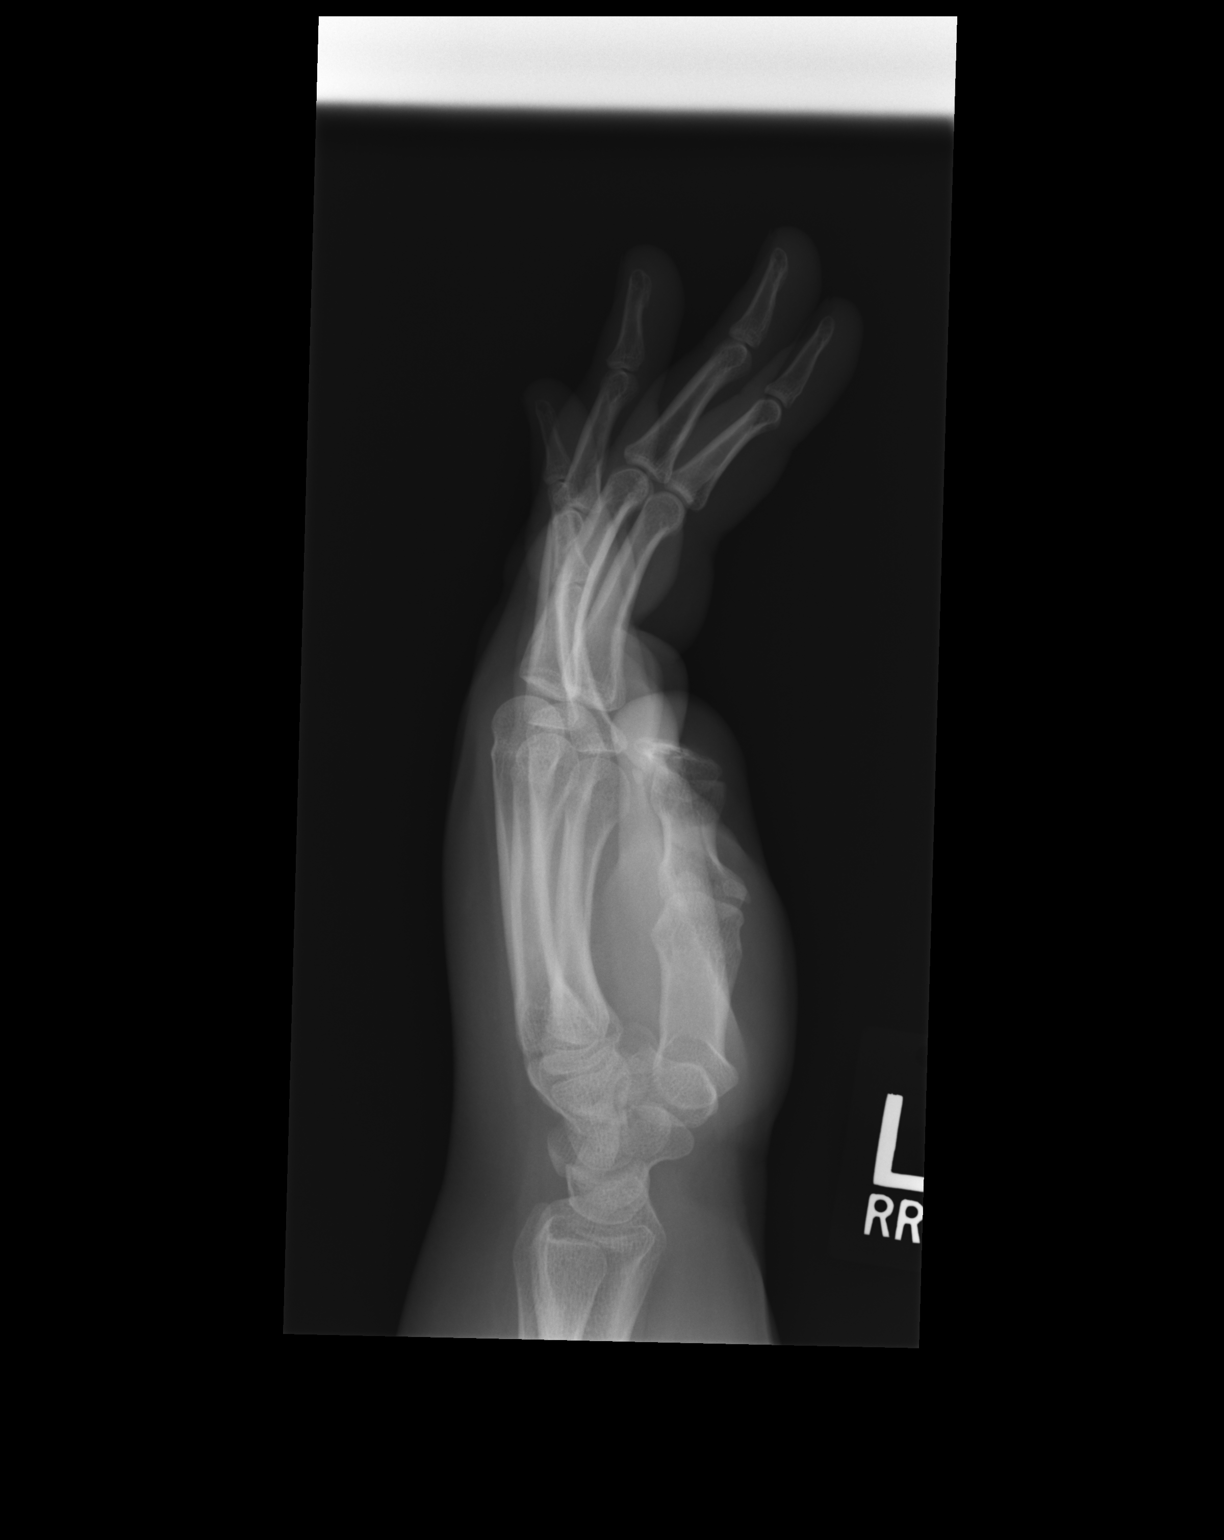

[pa obl]
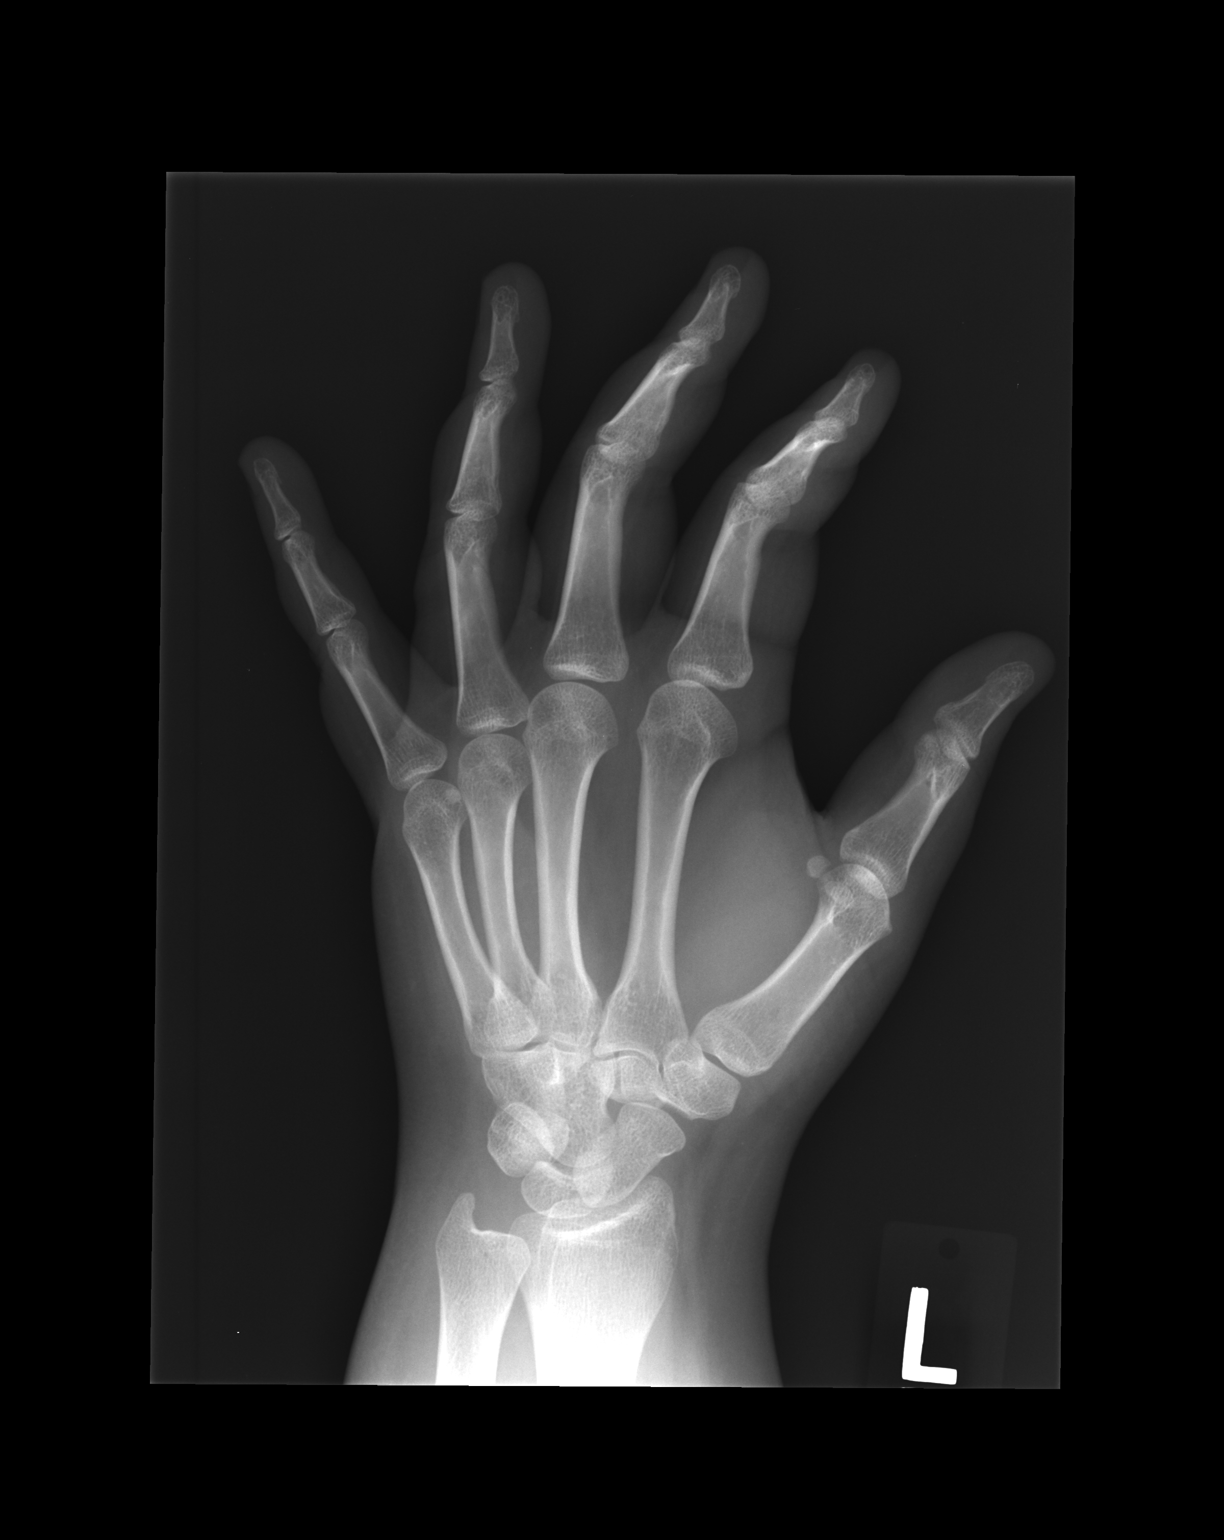

[3 of 3 positions shown; findings below may reference images not displayed]

FINDINGS: Osseous mineralization normal.

Joint spaces preserved.

Fingers superimposed on lateral view limiting assessment.

No acute fracture, dislocation, or bone destruction.
IMPRESSION: No acute osseous abnormalities.

## 2016-05-24 ENCOUNTER — Encounter (HOSPITAL_COMMUNITY): Payer: Self-pay

## 2016-09-21 ENCOUNTER — Encounter (HOSPITAL_COMMUNITY): Payer: Self-pay

## 2016-09-21 ENCOUNTER — Ambulatory Visit (HOSPITAL_COMMUNITY)
Admission: RE | Admit: 2016-09-21 | Discharge: 2016-09-21 | Disposition: A | Discharge status: Home / Self Care | Payer: BC Managed Care – PPO | Source: Ambulatory Visit | Attending: Obstetrics and Gynecology | Admitting: Obstetrics and Gynecology

## 2016-09-21 ENCOUNTER — Other Ambulatory Visit (HOSPITAL_COMMUNITY): Payer: Self-pay | Admitting: Obstetrics and Gynecology

## 2016-09-21 DIAGNOSIS — R1011 Right upper quadrant pain: Secondary | ICD-10-CM

## 2016-11-05 LAB — OB RESULTS CONSOLE GBS: STREP GROUP B AG: NEGATIVE

## 2016-11-19 ENCOUNTER — Encounter (HOSPITAL_COMMUNITY): Payer: Self-pay | Admitting: *Deleted

## 2016-11-19 ENCOUNTER — Telehealth (HOSPITAL_COMMUNITY): Payer: Self-pay | Admitting: *Deleted

## 2016-11-19 NOTE — Telephone Encounter (Signed)
Preadmission screen  

## 2016-11-25 NOTE — H&P (Signed)
Lindsay Horn is a 31 y.o. female G3P0020 at 39+ for IOL given term status and favorable cervix. 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  Also N&V throughout pregnancy - controlled with protonix, diclegis and reglan.  GBBS neg.  Received Tdap and Flu vaccine in pregnancy.  Normal genetic screening.  Also liver hemangioma noted with RUQ [place] - will f/u PP. D/W pt r/b/a of IOL.  Also complicated by maternal obesity.   OB History    Gravida Para Term Preterm AB Living   3 0 0 0 2 0   SAB TAB Ectopic Multiple Live Births   2 0 0        G1 13wk SAB G2 early SAB G3 present  No abn pap, no STD Uterine septum resected x 2  Past Medical History:  Diagnosis Date  . 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 Fetal demise, less than 22 weeks 12/11/2014  . Medical history non-contributory    Past Surgical History:  Procedure Laterality Date  . APPENDECTOMY    . APPENDECTOMY  2003  . DILATION AND EVACUATION N/A 12/12/2014   Procedure: DILATATION AND EVACUATION (D&E) 2ND TRIMESTER with ultrasound guidance;  Surgeon: Janyth Contes, MD;  Location: [facility];  Service: Gynecology;  Laterality: N/A;  . HYSTEROSCOPY     removal of uterine septum  . WISDOM TOOTH EXTRACTION  2003   Family History: breast cancer - mo - BRCA neg; ov CA, appy Social History:  reports that she has never smoked. She does not have any smokeless tobacco history on file. She reports that she drinks about 2.4 oz of alcohol per week . She reports that she does not use drugs.  No alcohol in pregnancy.  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   HENT: Negative.   Eyes: Negative.   Respiratory: Negative.   Cardiovascular: Negative.   Gastrointestinal: Positive for heartburn, nausea and vomiting.  Genitourinary: Negative.   Musculoskeletal: Positive for back pain.  Skin: Negative.   Neurological: Negative.   Psychiatric/Behavioral: Negative.    Maternal Medical History:  Reason for admission: Nausea.   Contractions: Frequency: irregular.    Fetal activity: Perceived fetal activity is normal.    Prenatal Complications - Diabetes: none.      Last menstrual period 02/25/2016, unknown if currently breastfeeding. Maternal Exam:  Abdomen: Patient reports no abdominal tenderness. Fundal height is appropriate for gestation.   Estimated fetal weight is 7-8#.   Fetal presentation: vertex  Introitus: Normal vulva. Normal vagina.    Physical Exam  Constitutional: She is oriented to person, place, and time. She appears well-developed and well-nourished.  HENT:  Head: Normocephalic and atraumatic.  Cardiovascular: Normal rate and regular rhythm.   Respiratory: Effort normal and breath sounds normal. No respiratory distress. She has no wheezes.  GI: Soft. Bowel sounds are normal. She exhibits no distension. There is no tenderness.  Musculoskeletal: Normal range of motion.  Neurological: She is alert and oriented to person, place, and time.  Skin: Skin is warm and dry.  Psychiatric:  She has a normal mood and affect. Her behavior is normal.    Prenatal labs: ABO, Rh: A/Positive/-- (10/16 0000) Antibody:  negative Rubella: Immune (10/16 0000) RPR: Nonreactive (10/16 0000)  HBsAg: Negative (10/16 0000)  HIV: Non-reactive (10/16 0000)  GBS: Negative (04/19 0000)   Flu and Tdap in [facility]  [place] by LMP cw First trimester [place]  Hgb 11.6/Plt 261/Ur Cx neg/GC neg/Chl neg/Varicella immune/glucola 134/ essential panel  neg  [place] good growth, nl anat, ant plac, surprise gender  Assessment/Plan: 30yo G3P0020 at 15 for IOL given term and favorable cervix gbbs neg - no propyhlaxis AROM, pitocin to augment Epidural, ,nitrous or IV stadol for pain mgmt Expect SVD   Noah Pelaez Bovard-Stuckert 11/25/2016, 9:09 PM

## 2016-11-27 ENCOUNTER — Inpatient Hospital Stay (HOSPITAL_COMMUNITY): Payer: BC Managed Care – PPO | Admitting: Anesthesiology

## 2016-11-27 ENCOUNTER — Inpatient Hospital Stay (HOSPITAL_COMMUNITY)
Admission: RE | Admit: 2016-11-27 | Discharge: 2016-11-30 | DRG: 765 | Disposition: A | Discharge status: Home / Self Care | Payer: BC Managed Care – PPO | Source: Ambulatory Visit | Attending: Obstetrics and Gynecology | Admitting: Obstetrics and Gynecology

## 2016-11-27 DIAGNOSIS — D1803 Hemangioma of intra-abdominal structures: Secondary | ICD-10-CM | POA: Diagnosis present

## 2016-11-27 DIAGNOSIS — Z6841 Body Mass Index (BMI) 40.0 and over, adult: Secondary | ICD-10-CM | POA: Diagnosis not present

## 2016-11-27 DIAGNOSIS — Z3A39 39 weeks gestation of pregnancy: Secondary | ICD-10-CM | POA: Diagnosis not present

## 2016-11-27 DIAGNOSIS — Q51810 Arcuate uterus: Secondary | ICD-10-CM

## 2016-11-27 DIAGNOSIS — Z98891 History of uterine scar from previous surgery: Secondary | ICD-10-CM

## 2016-11-27 DIAGNOSIS — Z3493 Encounter for supervision of normal pregnancy, unspecified, third trimester: Secondary | ICD-10-CM | POA: Diagnosis present

## 2016-11-27 DIAGNOSIS — O3403 Maternal care for unspecified congenital malformation of uterus, third trimester: Principal | ICD-10-CM | POA: Diagnosis present

## 2016-11-27 DIAGNOSIS — Z3490 Encounter for supervision of normal pregnancy, unspecified, unspecified trimester: Secondary | ICD-10-CM

## 2016-11-27 DIAGNOSIS — O99214 Obesity complicating childbirth: Secondary | ICD-10-CM | POA: Diagnosis present

## 2016-11-27 HISTORY — DX: History of uterine scar from previous surgery: Z98.891

## 2016-11-27 LAB — CBC
HCT: 34.8 % — ABNORMAL LOW (ref 36.0–46.0)
Hemoglobin: 11.5 g/dL — ABNORMAL LOW (ref 12.0–15.0)
MCH: 28.9 pg (ref 26.0–34.0)
MCHC: 33 g/dL (ref 30.0–36.0)
MCV: 87.4 fL (ref 78.0–100.0)
PLATELETS: 197 10*3/uL (ref 150–400)
RBC: 3.98 MIL/uL (ref 3.87–5.11)
RDW: 15.1 % (ref 11.5–15.5)
WBC: 7.3 10*3/uL (ref 4.0–10.5)

## 2016-11-27 LAB — RPR: RPR Ser Ql: NONREACTIVE

## 2016-11-27 LAB — TYPE AND SCREEN
ABO/RH(D): A POS
Antibody Screen: NEGATIVE

## 2016-11-27 MED ORDER — LIDOCAINE HCL (PF) 1 % IJ SOLN: 30.0000 mL | INTRAMUSCULAR | Status: DC | PRN

## 2016-11-27 MED ORDER — PHENYLEPHRINE 40 MCG/ML (10ML) SYRINGE FOR IV PUSH (FOR BLOOD PRESSURE SUPPORT): 80.0000 ug | PREFILLED_SYRINGE | INTRAVENOUS | Status: DC | PRN

## 2016-11-27 MED ORDER — OXYTOCIN 40 UNITS IN LACTATED RINGERS INFUSION - SIMPLE MED
1.0000 m[IU]/min | INTRAVENOUS | Status: DC
  Administered 2016-11-27: 2 m[IU]/min via INTRAVENOUS
  Filled 2016-11-27: qty 1000

## 2016-11-27 MED ORDER — FENTANYL CITRATE (PF) 100 MCG/2ML IJ SOLN
INTRAMUSCULAR | Status: DC | PRN
  Administered 2016-11-27 (×2): 50 ug via EPIDURAL

## 2016-11-27 MED ORDER — FENTANYL CITRATE (PF) 100 MCG/2ML IJ SOLN
100.0000 ug | Freq: Once | INTRAMUSCULAR | Status: AC
  Administered 2016-11-27: 100 ug via EPIDURAL

## 2016-11-27 MED ORDER — EPHEDRINE 5 MG/ML INJ: 10.0000 mg | INTRAVENOUS | Status: DC | PRN

## 2016-11-27 MED ORDER — FENTANYL 2.5 MCG/ML BUPIVACAINE 1/10 % EPIDURAL INFUSION (WH - ANES)
14.0000 mL/h | INTRAMUSCULAR | Status: DC | PRN
  Administered 2016-11-27 (×2): 14 mL/h via EPIDURAL
  Filled 2016-11-27: qty 100

## 2016-11-27 MED ORDER — SOD CITRATE-CITRIC ACID 500-334 MG/5ML PO SOLN
30.0000 mL | ORAL | Status: DC | PRN
  Administered 2016-11-28: 30 mL via ORAL
  Filled 2016-11-27: qty 15

## 2016-11-27 MED ORDER — ONDANSETRON HCL 4 MG/2ML IJ SOLN
4.0000 mg | Freq: Four times a day (QID) | INTRAMUSCULAR | Status: DC | PRN
  Administered 2016-11-27: 4 mg via INTRAVENOUS
  Filled 2016-11-27 (×2): qty 2

## 2016-11-27 MED ORDER — OXYTOCIN BOLUS FROM INFUSION: 500.0000 mL | Freq: Once | INTRAVENOUS | Status: DC

## 2016-11-27 MED ORDER — DIPHENHYDRAMINE HCL 50 MG/ML IJ SOLN
12.5000 mg | INTRAMUSCULAR | Status: DC | PRN
  Filled 2016-11-27: qty 1

## 2016-11-27 MED ORDER — OXYCODONE-ACETAMINOPHEN 5-325 MG PO TABS: 2.0000 | ORAL_TABLET | ORAL | Status: DC | PRN

## 2016-11-27 MED ORDER — LACTATED RINGERS IV SOLN
500.0000 mL | Freq: Once | INTRAVENOUS | Status: AC
  Administered 2016-11-28: 02:00:00 via INTRAVENOUS

## 2016-11-27 MED ORDER — GENTAMICIN SULFATE 40 MG/ML IJ SOLN
200.0000 mg | Freq: Three times a day (TID) | INTRAVENOUS | Status: DC
  Administered 2016-11-27: 200 mg via INTRAVENOUS
  Filled 2016-11-27 (×3): qty 5

## 2016-11-27 MED ORDER — TERBUTALINE SULFATE 1 MG/ML IJ SOLN
0.2500 mg | Freq: Once | INTRAMUSCULAR | Status: DC | PRN
  Filled 2016-11-27: qty 1

## 2016-11-27 MED ORDER — LACTATED RINGERS IV SOLN
500.0000 mL | INTRAVENOUS | Status: DC | PRN
  Administered 2016-11-27: 250 mL via INTRAVENOUS
  Administered 2016-11-27: 175 mL via INTRAVENOUS

## 2016-11-27 MED ORDER — SODIUM CHLORIDE 0.9 % IV SOLN
2.0000 g | Freq: Four times a day (QID) | INTRAVENOUS | Status: DC
  Administered 2016-11-27: 2 g via INTRAVENOUS
  Filled 2016-11-27 (×3): qty 2000

## 2016-11-27 MED ORDER — DIPHENHYDRAMINE HCL 50 MG/ML IJ SOLN
25.0000 mg | Freq: Once | INTRAMUSCULAR | Status: AC
  Administered 2016-11-27: 25 mg via INTRAVENOUS

## 2016-11-27 MED ORDER — OXYCODONE-ACETAMINOPHEN 5-325 MG PO TABS: 1.0000 | ORAL_TABLET | ORAL | Status: DC | PRN

## 2016-11-27 MED ORDER — LIDOCAINE HCL (PF) 1 % IJ SOLN
INTRAMUSCULAR | Status: DC | PRN
  Administered 2016-11-27: 5 mL via EPIDURAL
  Administered 2016-11-27: 2 mL via EPIDURAL
  Administered 2016-11-27: 3 mL via EPIDURAL

## 2016-11-27 MED ORDER — ACETAMINOPHEN 325 MG PO TABS: 650.0000 mg | ORAL_TABLET | ORAL | Status: DC | PRN

## 2016-11-27 MED ORDER — ACETAMINOPHEN 500 MG PO TABS
1000.0000 mg | ORAL_TABLET | Freq: Four times a day (QID) | ORAL | Status: DC | PRN
  Administered 2016-11-27: 1000 mg via ORAL
  Filled 2016-11-27: qty 2

## 2016-11-27 MED ORDER — OXYTOCIN 40 UNITS IN LACTATED RINGERS INFUSION - SIMPLE MED: 2.5000 [IU]/h | INTRAVENOUS | Status: DC

## 2016-11-27 MED ORDER — FENTANYL CITRATE (PF) 100 MCG/2ML IJ SOLN
INTRAMUSCULAR | Status: AC
  Filled 2016-11-27: qty 2

## 2016-11-27 MED ORDER — FENTANYL 2.5 MCG/ML BUPIVACAINE 1/10 % EPIDURAL INFUSION (WH - ANES)
INTRAMUSCULAR | Status: AC
  Filled 2016-11-27: qty 100

## 2016-11-27 MED ORDER — BUPIVACAINE HCL (PF) 0.25 % IJ SOLN
INTRAMUSCULAR | Status: DC | PRN
  Administered 2016-11-27 (×2): 4 mL via EPIDURAL

## 2016-11-27 MED ORDER — PHENYLEPHRINE 40 MCG/ML (10ML) SYRINGE FOR IV PUSH (FOR BLOOD PRESSURE SUPPORT)
PREFILLED_SYRINGE | INTRAVENOUS | Status: DC
Start: 2016-11-27 — End: 2016-11-27
  Filled 2016-11-27: qty 10

## 2016-11-27 MED ORDER — LACTATED RINGERS IV SOLN
INTRAVENOUS | Status: DC
  Administered 2016-11-27 (×3): via INTRAVENOUS

## 2016-11-27 MED ORDER — LACTATED RINGERS IV SOLN: 500.0000 mL | Freq: Once | INTRAVENOUS | Status: DC

## 2016-11-27 MED ORDER — BUTORPHANOL TARTRATE 1 MG/ML IJ SOLN: 1.0000 mg | INTRAMUSCULAR | Status: DC | PRN

## 2016-11-27 NOTE — Progress Notes (Signed)
Patient ID: Lindsay Horn, female   DOB: [birth year], 31 y.o.   MRN: [id number]   Some pressure with ctx  AFVSS gen NAD FHTs 130-140's mod var, category 2 toco Q 2-4 min  mVu = 160-170 SVE 7.8/90/0   Will trypeanut, monitor closely

## 2016-11-27 NOTE — Progress Notes (Addendum)
Patient ID: Lindsay Horn, female   DOB: 08-11-1985, 31 y.o.   MRN: [id number]  Some pressure with ctx.  T = 100.4, VSS gen NAD FHTs 150's, min/mod var; category 2 toco q 25min  Adequate mVus  8/90/0  Turned under around (side to hands and knees to side) Exaggerated Sims on left  Close monitoring Redose epidural Tylenol and amp/gent

## 2016-11-27 NOTE — Progress Notes (Signed)
Patient ID: Lindsay Horn, female   DOB: June 20, 1986, 31 y.o.   MRN: [id number]   D/w pt little/no cervical change since 5pm.  D/W pt goals of labor.  D/W pt chance for need of LTCS including r/b/a - inc but not limited to bleeding, infection, damage to surrounding organs, injury to infant or difficulty healing.    FHTs 150's min-mod var, category 2 toco Q 2-3 min  Adequate mVu's  Pt more comfortable with redose. Will rest/sleep.  Recheck at 10:30pm

## 2016-11-27 NOTE — Progress Notes (Signed)
Patient ID: Lindsay Horn, female   DOB: 1986/05/21, 31 y.o.   MRN: [id number]   Reviewed H&P, no changes.  D/w pt IOL  AFVSS gen NAD FHTs 120 - 125's, mod var, + accels, category 1 toco irr  Attempt at rupture unsuccessful 2/70/-2  Will start pitocin to get baby applied, attemp trupture again at lunch

## 2016-11-27 NOTE — Anesthesia Procedure Notes (Signed)
Epidural Patient location during procedure: OB Start time: 11/27/2016 12:05 PM End time: 11/27/2016 12:10 PM  Staffing Anesthesiologist: Catalina Gravel Performed: anesthesiologist   Preanesthetic Checklist Completed: patient identified, pre-op evaluation, timeout performed, IV checked, risks and benefits discussed and monitors and equipment checked  Epidural Patient position: sitting Prep: DuraPrep Patient monitoring: blood pressure and continuous pulse ox Approach: midline Location: L3-L4 Injection technique: LOR air  Needle:  Needle type: Tuohy  Needle gauge: 17 G Needle length: 9 cm Needle insertion depth: 7 cm Catheter size: 19 Gauge Catheter at skin depth: 12 cm Test dose: negative and Other (1% Lidocaine)  Additional Notes Patient identified.  Risk benefits discussed including failed block, incomplete pain control, headache, nerve damage, paralysis, blood pressure changes, nausea, vomiting, reactions to medication both toxic or allergic, and postpartum back pain.  Patient expressed understanding and wished to proceed.  All questions were answered.  Sterile technique used throughout procedure and epidural site dressed with sterile barrier dressing. No paresthesia or other complications noted. The patient did not experience any signs of intravascular injection such as tinnitus or metallic taste in mouth nor signs of intrathecal spread such as rapid motor block. Please see nursing notes for vital signs. Reason for block:procedure for pain

## 2016-11-27 NOTE — Anesthesia Preprocedure Evaluation (Signed)
Anesthesia Evaluation  Patient identified by MRN, date of birth, ID band Patient awake    Reviewed: Allergy & Precautions, NPO status , Patient's Chart, lab work & pertinent test results  Airway Mallampati: II  TM Distance: >3 FB Neck ROM: Full    Dental  (+) Teeth Intact, Dental Advisory Given   Pulmonary neg pulmonary ROS,    Pulmonary exam normal breath sounds clear to auscultation       Cardiovascular negative cardio ROS Normal cardiovascular exam Rhythm:Regular Rate:Normal     Neuro/Psych negative neurological ROS     GI/Hepatic Neg liver ROS, GERD  Medicated,  Endo/Other  Morbid obesity  Renal/GU negative Renal ROS     Musculoskeletal negative musculoskeletal ROS (+)   Abdominal   Peds  Hematology  (+) Blood dyscrasia, anemia , Plt 197k   Anesthesia Other Findings Day of surgery medications reviewed with the patient.  Reproductive/Obstetrics (+) Pregnancy                             Anesthesia Physical Anesthesia Plan  ASA: III  Anesthesia Plan: Epidural   Post-op Pain Management:    Induction:   Airway Management Planned:   Additional Equipment:   Intra-op Plan:   Post-operative Plan:   Informed Consent: I have reviewed the patients History and Physical, chart, labs and discussed the procedure including the risks, benefits and alternatives for the proposed anesthesia with the patient or authorized representative who has indicated his/her understanding and acceptance.   Dental advisory given  Plan Discussed with:   Anesthesia Plan Comments: (Patient identified. Risks/Benefits/Options discussed with patient including but not limited to bleeding, infection, nerve damage, paralysis, failed block, incomplete pain control, headache, blood pressure changes, nausea, vomiting, reactions to medication both or allergic, itching and postpartum back pain. Confirmed with bedside  nurse the patient's most recent platelet count. Confirmed with patient that they are not currently taking any anticoagulation, have any bleeding history or any family history of bleeding disorders. Patient expressed understanding and wished to proceed. All questions were answered. )        Anesthesia Quick Evaluation

## 2016-11-27 NOTE — Progress Notes (Signed)
Pharmacy Antibiotic Note  Lindsay Horn is a 31 y.o. female admitted on 11/27/2016 with IOL at 39+ weeks.  Pharmacy has been consulted for Gentamicin dosing for r/o chorioamnionitis/ Triple I due to maternal temp during labor.  Plan: Gentamicin 200mg  IV q8h Will continue to follow and assess need for further workup and labs  Height: 5\' 4"  (162.6 cm) Weight: 290 lb (131.5 kg) IBW/kg (Calculated) : 54.7  Dosing weight : 77.7kg  Temp (24hrs), Avg:99.9 F (37.7 C), Min:98.8 F (37.1 C), Max:100.5 F (38.1 C)   Recent Labs Lab 11/27/16 0817  WBC 7.3    CrCl cannot be calculated (Patient's most recent lab result is older than the maximum 21 days allowed.).   Estimated SCR=0.7 with estimated CrCl ~ 145ml/min  Allergies  Allergen Reactions  . Codeine Hives    Pt reports taking percocet in the past with no problems    Antimicrobials this admission: Ampicillin 2 gram IV q6h  Dose adjustments this admission:   Microbiology results:   Thank you for allowing pharmacy to be a part of this patient's care.  Vernie Ammons 11/27/2016 9:48 PM

## 2016-11-27 NOTE — Progress Notes (Signed)
Patient ID: Lindsay Horn, female   DOB: 12-Jul-1986, 31 y.o.   MRN: [id number]  Comfortable with epidural  AFVSS gen NAD FHTs 125-130's, mod var, + accels toco Q 94min mVu 160-170  SVE 4/80/-1  continue current mgmt Expect SVD

## 2016-11-27 NOTE — Anesthesia Pain Management Evaluation Note (Signed)
  [facility]NA Pain Management Visit Note  Patient: Lindsay Horn, 31 y.o., female  "Hello I am a member of the anesthesia team at [facility]. We have an anesthesia team available at all times to provide care throughout the hospital, including epidural management and anesthesia for C-section. I don't know your plan for the delivery whether it a natural birth, water birth, IV sedation, nitrous supplementation, doula or epidural, but we want to meet your pain goals."   1.Was your pain managed to your expectations on prior hospitalizations?   No prior hospitalizations  2.What is your expectation for pain management during this hospitalization?     Epidural  3.How can we help you reach that goal? epidural  Record the patient's initial score and the patient's pain goal.   Pain: 0  Pain Goal: 8 [facility] wants you to be able to say your pain was always managed very well.  Rayvon Char 11/27/2016

## 2016-11-28 ENCOUNTER — Encounter (HOSPITAL_COMMUNITY): Payer: Self-pay

## 2016-11-28 ENCOUNTER — Encounter (HOSPITAL_COMMUNITY)
Admission: RE | Disposition: A | Discharge status: Home / Self Care | Payer: Self-pay | Source: Ambulatory Visit | Attending: Obstetrics and Gynecology

## 2016-11-28 DIAGNOSIS — Z98891 History of uterine scar from previous surgery: Secondary | ICD-10-CM

## 2016-11-28 HISTORY — DX: History of uterine scar from previous surgery: Z98.891

## 2016-11-28 SURGERY — Surgical Case
Anesthesia: Regional

## 2016-11-28 MED ORDER — ACETAMINOPHEN 325 MG PO TABS: 650.0000 mg | ORAL_TABLET | ORAL | Status: DC | PRN

## 2016-11-28 MED ORDER — SENNOSIDES-DOCUSATE SODIUM 8.6-50 MG PO TABS
2.0000 | ORAL_TABLET | ORAL | Status: DC
  Administered 2016-11-29 (×2): 2 via ORAL
  Filled 2016-11-28 (×2): qty 2

## 2016-11-28 MED ORDER — MORPHINE SULFATE (PF) 0.5 MG/ML IJ SOLN
INTRAMUSCULAR | Status: DC | PRN
  Administered 2016-11-28: 4 mg via EPIDURAL

## 2016-11-28 MED ORDER — KETOROLAC TROMETHAMINE 30 MG/ML IJ SOLN
30.0000 mg | Freq: Four times a day (QID) | INTRAMUSCULAR | Status: AC | PRN
  Administered 2016-11-28: 30 mg via INTRAMUSCULAR

## 2016-11-28 MED ORDER — SCOPOLAMINE 1 MG/3DAYS TD PT72
MEDICATED_PATCH | TRANSDERMAL | Status: AC
  Filled 2016-11-28: qty 1

## 2016-11-28 MED ORDER — OXYTOCIN 40 UNITS IN LACTATED RINGERS INFUSION - SIMPLE MED: 2.5000 [IU]/h | INTRAVENOUS | Status: AC

## 2016-11-28 MED ORDER — NALBUPHINE HCL 10 MG/ML IJ SOLN: 5.0000 mg | Freq: Once | INTRAMUSCULAR | Status: DC | PRN

## 2016-11-28 MED ORDER — OXYTOCIN 10 UNIT/ML IJ SOLN
INTRAVENOUS | Status: DC | PRN
  Administered 2016-11-28: 40 [IU] via INTRAVENOUS

## 2016-11-28 MED ORDER — NALBUPHINE HCL 10 MG/ML IJ SOLN: 5.0000 mg | INTRAMUSCULAR | Status: DC | PRN

## 2016-11-28 MED ORDER — NALOXONE HCL 0.4 MG/ML IJ SOLN: 0.4000 mg | INTRAMUSCULAR | Status: DC | PRN

## 2016-11-28 MED ORDER — ZOLPIDEM TARTRATE 5 MG PO TABS: 5.0000 mg | ORAL_TABLET | Freq: Every evening | ORAL | Status: DC | PRN

## 2016-11-28 MED ORDER — NALOXONE HCL 2 MG/2ML IJ SOSY
1.0000 ug/kg/h | PREFILLED_SYRINGE | INTRAMUSCULAR | Status: DC | PRN
  Filled 2016-11-28: qty 2

## 2016-11-28 MED ORDER — ONDANSETRON HCL 4 MG/2ML IJ SOLN
4.0000 mg | Freq: Three times a day (TID) | INTRAMUSCULAR | Status: DC | PRN
  Administered 2016-11-28: 4 mg via INTRAVENOUS
  Filled 2016-11-28: qty 2

## 2016-11-28 MED ORDER — KETOROLAC TROMETHAMINE 30 MG/ML IJ SOLN: 30.0000 mg | Freq: Four times a day (QID) | INTRAMUSCULAR | Status: AC | PRN

## 2016-11-28 MED ORDER — DIPHENHYDRAMINE HCL 50 MG/ML IJ SOLN
INTRAMUSCULAR | Status: AC
  Filled 2016-11-28: qty 1

## 2016-11-28 MED ORDER — CLINDAMYCIN PHOSPHATE 900 MG/50ML IV SOLN
900.0000 mg | Freq: Once | INTRAVENOUS | Status: AC
  Administered 2016-11-28: 900 mg via INTRAVENOUS
  Filled 2016-11-28: qty 50

## 2016-11-28 MED ORDER — PROMETHAZINE HCL 25 MG/ML IJ SOLN: 6.2500 mg | INTRAMUSCULAR | Status: DC | PRN

## 2016-11-28 MED ORDER — NALBUPHINE HCL 10 MG/ML IJ SOLN
5.0000 mg | INTRAMUSCULAR | Status: DC | PRN
Start: 2016-11-28 — End: 2016-11-30
  Administered 2016-11-28 (×2): 5 mg via INTRAVENOUS
  Filled 2016-11-28 (×2): qty 1

## 2016-11-28 MED ORDER — LACTATED RINGERS IV SOLN
INTRAVENOUS | Status: DC
  Administered 2016-11-28: 125 mL/h via INTRAVENOUS
  Administered 2016-11-28: 10:00:00 via INTRAVENOUS

## 2016-11-28 MED ORDER — MENTHOL 3 MG MT LOZG: 1.0000 | LOZENGE | OROMUCOSAL | Status: DC | PRN

## 2016-11-28 MED ORDER — SCOPOLAMINE 1 MG/3DAYS TD PT72
MEDICATED_PATCH | TRANSDERMAL | Status: DC | PRN
  Administered 2016-11-28: 1 via TRANSDERMAL

## 2016-11-28 MED ORDER — KETOROLAC TROMETHAMINE 30 MG/ML IJ SOLN
INTRAMUSCULAR | Status: AC
  Administered 2016-11-28: 30 mg via INTRAMUSCULAR
  Filled 2016-11-28: qty 1

## 2016-11-28 MED ORDER — IBUPROFEN 800 MG PO TABS
800.0000 mg | ORAL_TABLET | Freq: Three times a day (TID) | ORAL | Status: DC
  Administered 2016-11-28 – 2016-11-30 (×6): 800 mg via ORAL
  Filled 2016-11-28 (×7): qty 1

## 2016-11-28 MED ORDER — SIMETHICONE 80 MG PO CHEW: 80.0000 mg | CHEWABLE_TABLET | ORAL | Status: DC | PRN

## 2016-11-28 MED ORDER — LACTATED RINGERS IV SOLN
INTRAVENOUS | Status: DC | PRN
  Administered 2016-11-28 (×3): via INTRAVENOUS

## 2016-11-28 MED ORDER — HYDROMORPHONE HCL 1 MG/ML IJ SOLN
0.2500 mg | INTRAMUSCULAR | Status: DC | PRN
Start: 2016-11-28 — End: 2016-11-28

## 2016-11-28 MED ORDER — PRENATAL MULTIVITAMIN CH
1.0000 | ORAL_TABLET | Freq: Every day | ORAL | Status: DC
  Administered 2016-11-29: 1 via ORAL
  Filled 2016-11-28: qty 1

## 2016-11-28 MED ORDER — ONDANSETRON HCL 4 MG/2ML IJ SOLN
INTRAMUSCULAR | Status: AC
  Filled 2016-11-28: qty 2

## 2016-11-28 MED ORDER — DIPHENHYDRAMINE HCL 25 MG PO CAPS
25.0000 mg | ORAL_CAPSULE | ORAL | Status: DC | PRN
  Filled 2016-11-28: qty 1

## 2016-11-28 MED ORDER — MIDAZOLAM HCL 2 MG/2ML IJ SOLN
INTRAMUSCULAR | Status: DC | PRN
  Administered 2016-11-28: 1 mg via INTRAVENOUS

## 2016-11-28 MED ORDER — DEXAMETHASONE SODIUM PHOSPHATE 10 MG/ML IJ SOLN
INTRAMUSCULAR | Status: AC
  Filled 2016-11-28: qty 1

## 2016-11-28 MED ORDER — LORATADINE 10 MG PO TABS
10.0000 mg | ORAL_TABLET | Freq: Every day | ORAL | Status: DC
  Administered 2016-11-28 – 2016-11-29 (×2): 10 mg via ORAL
  Filled 2016-11-28 (×3): qty 1

## 2016-11-28 MED ORDER — DEXAMETHASONE SODIUM PHOSPHATE 10 MG/ML IJ SOLN
INTRAMUSCULAR | Status: DC | PRN
  Administered 2016-11-28: 10 mg via INTRAVENOUS

## 2016-11-28 MED ORDER — DIPHENHYDRAMINE HCL 25 MG PO CAPS: 25.0000 mg | ORAL_CAPSULE | Freq: Four times a day (QID) | ORAL | Status: DC | PRN

## 2016-11-28 MED ORDER — WITCH HAZEL-GLYCERIN EX PADS: 1.0000 "application " | MEDICATED_PAD | CUTANEOUS | Status: DC | PRN

## 2016-11-28 MED ORDER — SODIUM BICARBONATE 8.4 % IV SOLN
INTRAVENOUS | Status: DC | PRN
  Administered 2016-11-28 (×3): 5 mL via EPIDURAL

## 2016-11-28 MED ORDER — SIMETHICONE 80 MG PO CHEW
80.0000 mg | CHEWABLE_TABLET | Freq: Three times a day (TID) | ORAL | Status: DC
  Administered 2016-11-28 – 2016-11-30 (×7): 80 mg via ORAL
  Filled 2016-11-28 (×7): qty 1

## 2016-11-28 MED ORDER — OXYCODONE HCL 5 MG PO TABS: 5.0000 mg | ORAL_TABLET | ORAL | Status: DC | PRN

## 2016-11-28 MED ORDER — DIPHENHYDRAMINE HCL 50 MG/ML IJ SOLN
12.5000 mg | INTRAMUSCULAR | Status: DC | PRN
  Administered 2016-11-28: 12.5 mg via INTRAVENOUS

## 2016-11-28 MED ORDER — DEXTROSE 5 % IV SOLN
2.0000 g | Freq: Two times a day (BID) | INTRAVENOUS | Status: AC
  Administered 2016-11-28 (×2): 2 g via INTRAVENOUS
  Filled 2016-11-28 (×3): qty 2

## 2016-11-28 MED ORDER — SODIUM CHLORIDE 0.9 % IR SOLN
Status: DC | PRN
  Administered 2016-11-28: 1000 mL

## 2016-11-28 MED ORDER — SODIUM CHLORIDE 0.9% FLUSH: 3.0000 mL | INTRAVENOUS | Status: DC | PRN

## 2016-11-28 MED ORDER — COCONUT OIL OIL
1.0000 "application " | TOPICAL_OIL | Status: DC | PRN
  Administered 2016-11-29: 1 via TOPICAL
  Filled 2016-11-28: qty 120

## 2016-11-28 MED ORDER — MIDAZOLAM HCL 2 MG/2ML IJ SOLN
INTRAMUSCULAR | Status: AC
  Filled 2016-11-28: qty 2

## 2016-11-28 MED ORDER — OXYCODONE HCL 5 MG PO TABS: 10.0000 mg | ORAL_TABLET | ORAL | Status: DC | PRN

## 2016-11-28 MED ORDER — MORPHINE SULFATE (PF) 0.5 MG/ML IJ SOLN
INTRAMUSCULAR | Status: AC
  Filled 2016-11-28: qty 10

## 2016-11-28 MED ORDER — PANTOPRAZOLE SODIUM 40 MG PO TBEC
40.0000 mg | DELAYED_RELEASE_TABLET | Freq: Every day | ORAL | Status: DC
  Administered 2016-11-28 – 2016-11-30 (×3): 40 mg via ORAL
  Filled 2016-11-28 (×3): qty 1

## 2016-11-28 MED ORDER — SIMETHICONE 80 MG PO CHEW
80.0000 mg | CHEWABLE_TABLET | ORAL | Status: DC
  Administered 2016-11-29: 80 mg via ORAL
  Filled 2016-11-28 (×2): qty 1

## 2016-11-28 MED ORDER — MEPERIDINE HCL 25 MG/ML IJ SOLN: 6.2500 mg | INTRAMUSCULAR | Status: DC | PRN

## 2016-11-28 MED ORDER — LIDOCAINE-EPINEPHRINE (PF) 2 %-1:200000 IJ SOLN
INTRAMUSCULAR | Status: AC
Start: 2016-11-28 — End: 2016-11-28
  Filled 2016-11-28: qty 20

## 2016-11-28 MED ORDER — SODIUM BICARBONATE 8.4 % IV SOLN
INTRAVENOUS | Status: AC
Start: 2016-11-28 — End: 2016-11-28
  Filled 2016-11-28: qty 50

## 2016-11-28 MED ORDER — ONDANSETRON HCL 4 MG/2ML IJ SOLN
INTRAMUSCULAR | Status: DC | PRN
  Administered 2016-11-28: 4 mg via INTRAVENOUS

## 2016-11-28 MED ORDER — SCOPOLAMINE 1 MG/3DAYS TD PT72
1.0000 | MEDICATED_PATCH | Freq: Once | TRANSDERMAL | Status: DC
  Filled 2016-11-28: qty 1

## 2016-11-28 MED ORDER — OXYTOCIN 10 UNIT/ML IJ SOLN
INTRAMUSCULAR | Status: AC
  Filled 2016-11-28: qty 4

## 2016-11-28 MED ORDER — DIBUCAINE 1 % RE OINT: 1.0000 "application " | TOPICAL_OINTMENT | RECTAL | Status: DC | PRN

## 2016-11-28 SURGICAL SUPPLY — 42 items
APL SKNCLS STERI-STRIP NONHPOA (GAUZE/BANDAGES/DRESSINGS) ×1
BENZOIN TINCTURE PRP APPL 2/3 (GAUZE/BANDAGES/DRESSINGS) ×3 IMPLANT
CHLORAPREP W/TINT 26ML (MISCELLANEOUS) ×3 IMPLANT
CLAMP CORD UMBIL (MISCELLANEOUS) IMPLANT
CLOSURE STERI-STRIP 1/2X4 (GAUZE/BANDAGES/DRESSINGS) ×1
CLOSURE WOUND 1/2 X4 (GAUZE/BANDAGES/DRESSINGS) ×1
CLOTH BEACON ORANGE TIMEOUT ST (SAFETY) ×3 IMPLANT
CLSR STERI-STRIP ANTIMIC 1/2X4 (GAUZE/BANDAGES/DRESSINGS) ×1 IMPLANT
CONTAINER PREFILL 10% NBF 15ML (MISCELLANEOUS) IMPLANT
DRSG OPSITE POSTOP 4X10 (GAUZE/BANDAGES/DRESSINGS) ×3 IMPLANT
ELECT REM PT RETURN 9FT ADLT (ELECTROSURGICAL) ×3
ELECTRODE REM PT RTRN 9FT ADLT (ELECTROSURGICAL) ×1 IMPLANT
EXTRACTOR VACUUM M CUP 4 TUBE (SUCTIONS) IMPLANT
EXTRACTOR VACUUM M CUP 4' TUBE (SUCTIONS)
GLOVE BIO SURGEON STRL SZ 6.5 (GLOVE) ×2 IMPLANT
GLOVE BIO SURGEONS STRL SZ 6.5 (GLOVE) ×1
GLOVE BIOGEL PI IND STRL 7.0 (GLOVE) ×1 IMPLANT
GLOVE BIOGEL PI INDICATOR 7.0 (GLOVE) ×2
GOWN STRL REUS W/TWL LRG LVL3 (GOWN DISPOSABLE) ×6 IMPLANT
HOVERMATT SINGLE USE (MISCELLANEOUS) ×2 IMPLANT
KIT ABG SYR 3ML LUER SLIP (SYRINGE) IMPLANT
NDL HYPO 25X5/8 SAFETYGLIDE (NEEDLE) IMPLANT
NEEDLE HYPO 25X5/8 SAFETYGLIDE (NEEDLE) IMPLANT
NS IRRIG 1000ML POUR BTL (IV SOLUTION) ×3 IMPLANT
PACK C SECTION WH (CUSTOM PROCEDURE TRAY) ×3 IMPLANT
PAD OB MATERNITY 4.3X12.25 (PERSONAL CARE ITEMS) ×3 IMPLANT
PENCIL SMOKE EVAC W/HOLSTER (ELECTROSURGICAL) ×3 IMPLANT
RTRCTR C-SECT PINK 25CM LRG (MISCELLANEOUS) ×3 IMPLANT
SLEEVE SCD COMPRESS KNEE LRG (MISCELLANEOUS) ×2 IMPLANT
STRIP CLOSURE SKIN 1/2X4 (GAUZE/BANDAGES/DRESSINGS) ×2 IMPLANT
SUT MNCRL 0 VIOLET CTX 36 (SUTURE) ×2 IMPLANT
SUT MONOCRYL 0 CTX 36 (SUTURE) ×4
SUT PLAIN 1 NONE 54 (SUTURE) IMPLANT
SUT PLAIN 2 0 XLH (SUTURE) ×3 IMPLANT
SUT VIC AB 0 CT1 27 (SUTURE) ×6
SUT VIC AB 0 CT1 27XBRD ANBCTR (SUTURE) ×2 IMPLANT
SUT VIC AB 2-0 CT1 27 (SUTURE) ×3
SUT VIC AB 2-0 CT1 TAPERPNT 27 (SUTURE) ×1 IMPLANT
SUT VIC AB 4-0 KS 27 (SUTURE) ×3 IMPLANT
SYR BULB IRRIGATION 50ML (SYRINGE) ×3 IMPLANT
TOWEL OR 17X24 6PK STRL BLUE (TOWEL DISPOSABLE) ×3 IMPLANT
TRAY FOLEY BAG SILVER LF 14FR (SET/KITS/TRAYS/PACK) ×3 IMPLANT

## 2016-11-28 NOTE — Progress Notes (Signed)
Patient ID: Lindsay Horn, female   DOB: Sep 11, 1985, 31 y.o.   MRN: [id number]  Comfortable with epidural.  AF, Tm = 101.6 VSS gen NAD FHTs 140, mod var, early, variable and late decels, category 2 toco Q 2-48min mVU 160-260  SVE 8/80/0, with SVE mod meconium  D/W pt little/no cervical change in > 6 hr.  D/W pt arrest of dilation, d/w pt r/b/a of LTCS.   Will proceed when OR ready

## 2016-11-28 NOTE — Brief Op Note (Signed)
11/27/2016 - 11/28/2016  3:00 AM  PATIENT:  Lindsay Horn  31 y.o. female  PRE-OPERATIVE DIAGNOSIS:  cesarean section due to arrest of dilation  POST-OPERATIVE DIAGNOSIS:  cesarean section due to arrest of dilation  PROCEDURE:  Procedure(s): CESAREAN SECTION (N/A)  SURGEON:  Surgeon(s) and Role:    * Bovard-Stuckert, Haely Leyland, MD - Primary  FINDINGS: viable female infant at 2:04, apgars 8/9 wt P; arcuate uterus, nl tubes and ovaries.  No septum palpated.    ANESTHESIA:   epidural  EBL:  Total I/O In: 2900 [I.V.:2900] Out: 1300 [Urine:400; Blood:900]  BLOOD ADMINISTERED:none  DRAINS: Urinary Catheter (Foley)   LOCAL MEDICATIONS USED:  NONE  SPECIMEN:  Source of Specimen:  Placenta  DISPOSITION OF SPECIMEN:  PATHOLOGY  COUNTS:  YES  TOURNIQUET:  * No tourniquets in log *  DICTATION: .Other Dictation: Dictation Number [id number]  PLAN OF CARE: Admit to inpatient   PATIENT DISPOSITION:  PACU - hemodynamically stable.   Delay start of Pharmacological VTE agent (>24hrs) due to surgical blood loss or risk of bleeding: not applicable

## 2016-11-28 NOTE — Anesthesia Postprocedure Evaluation (Signed)
Anesthesia Post Note  Patient: Lindsay Horn  Procedure(s) Performed: Procedure(s) (LRB): CESAREAN SECTION (N/A)  Patient location during evaluation: PACU Anesthesia Type: MAC and Epidural Level of consciousness: awake and alert Pain management: pain level controlled Vital Signs Assessment: post-procedure vital signs reviewed and stable Respiratory status: spontaneous breathing and respiratory function stable Cardiovascular status: blood pressure returned to baseline and stable Postop Assessment: spinal receding Anesthetic complications: no        Last Vitals:  Vitals:   11/28/16 0335 11/28/16 0345  BP: 135/84 (!) 147/82  Pulse: (!) 102 (!) 109  Resp: (!) 21 (!) 35  Temp:      Last Pain:  Vitals:   11/28/16 0345  TempSrc:   PainSc: 5    Pain Goal:    LLE Motor Response: Purposeful movement (11/28/16 0345)   RLE Motor Response: Purposeful movement (11/28/16 0345)        Duane Boston DANIEL

## 2016-11-28 NOTE — Progress Notes (Signed)
Subjective: Postpartum Day 0: Cesarean Delivery Patient reports incisional pain.  Pain controlled, nl lochia  Objective: Vital signs in last 24 hours: Temp:  [97.9 F (36.6 C)-101.6 F (38.7 C)] 97.9 F (36.6 C) (05/12 0810) Pulse Rate:  [74-141] 84 (05/12 0810) Resp:  [16-35] 16 (05/12 0810) BP: (88-147)/(44-86) 102/55 (05/12 0810) SpO2:  [96 %-100 %] 98 % (05/12 0810)  Physical Exam:  General: alert and no distress Lochia: appropriate Uterine Fundus: firm Incision: healing well DVT Evaluation: No evidence of DVT seen on physical exam.   Recent Labs  11/27/16 0817  HGB 11.5*  HCT 34.8*    Assessment/Plan: Status post Cesarean section. Doing well postoperatively.  Continue current care.  Lindsay Horn 11/28/2016, 10:01 AM

## 2016-11-28 NOTE — Addendum Note (Signed)
Addendum  created 11/28/16 1406 by Rayvon Char, CRNA   Sign clinical note

## 2016-11-28 NOTE — Transfer of Care (Signed)
Immediate Anesthesia Transfer of Care Note  Patient: Lindsay Horn  Procedure(s) Performed: Procedure(s): CESAREAN SECTION (N/A)  Patient Location: PACU  Anesthesia Type:Epidural  Level of Consciousness: awake  Airway & Oxygen Therapy: Patient Spontanous Breathing  Post-op Assessment: Report given to RN and Post -op Vital signs reviewed and stable  Post vital signs: stable  Last Vitals:  Vitals:   11/28/16 0002 11/28/16 0005  BP: (!) 88/47 117/65  Pulse: (!) 121 (!) 123  Resp:    Temp: 37.2 C     Last Pain:  Vitals:   11/28/16 0002  TempSrc: Oral         Complications: No apparent anesthesia complications

## 2016-11-28 NOTE — Lactation Note (Signed)
This note was copied from a baby's chart. Lactation Consultation Note  Patient Name: Lindsay Horn'V Date: 11/28/2016 Reason for consult: Initial assessment  with this mom of a term baby, now 38 hours old. Mom has large soft breasts, with everted but soft nipples, that flatten with latch. I tried to get baby to latch with tea cup hold, but he was not able to. I then fitted mom with 24 nipple shield, with some expressed colostrum in the shield, and he latched briefly, came off, and then latched to mom's breast without shield, and stayed latched for 10 minutes, with a deep latch and good breast movement. Basic breast feeding teaching done with parents, as well as lactation services. Mom knows to call for questions/concerns.    Maternal Data Formula Feeding for Exclusion: No Has patient been taught Hand Expression?: Yes Does the patient have breastfeeding experience prior to this delivery?: No  Feeding Feeding Type: Breast Fed Length of feed: 10 min  LATCH Score/Interventions Latch: Repeated attempts needed to sustain latch, nipple held in mouth throughout feeding, stimulation needed to elicit sucking reflex. Intervention(s): Adjust position;Assist with latch;Breast massage;Breast compression  Audible Swallowing: A few with stimulation Intervention(s): Skin to skin;Hand expression  Type of Nipple: Flat (24 nipple shield)  Comfort (Breast/Nipple): Soft / non-tender     Hold (Positioning): Assistance needed to correctly position infant at breast and maintain latch. Intervention(s): Breastfeeding basics reviewed;Support Pillows;Position options;Skin to skin  LATCH Score: 6  Lactation Tools Discussed/Used Tools: Nipple Shields Nipple shield size: 24 WIC Program: No   Consult Status Consult Status: Follow-up Date: 11/29/16 Follow-up type: In-patient    [name] 11/28/2016, 3:32 PM

## 2016-11-28 NOTE — Anesthesia Postprocedure Evaluation (Addendum)
Anesthesia Post Note  Patient: Lindsay Horn  Procedure(s) Performed: Procedure(s) (LRB): CESAREAN SECTION (N/A)  Patient location during evaluation: Mother Baby Anesthesia Type: Epidural Level of consciousness: awake and alert Pain management: pain level controlled Vital Signs Assessment: post-procedure vital signs reviewed and stable Respiratory status: spontaneous breathing, nonlabored ventilation and respiratory function stable Cardiovascular status: stable Postop Assessment: no headache, no backache, epidural receding and patient able to bend at knees Anesthetic complications: no        Last Vitals:  Vitals:   11/28/16 0643 11/28/16 0810  BP: 123/75 (!) 102/55  Pulse: 81 84  Resp: 18 16  Temp: 36.8 C 36.6 C    Last Pain:  Vitals:   11/28/16 1200  TempSrc:   PainSc: 3    Pain Goal: Patients Stated Pain Goal: 3 (11/28/16 1000)               Rayvon Char

## 2016-11-29 ENCOUNTER — Encounter (HOSPITAL_COMMUNITY): Payer: Self-pay | Admitting: Obstetrics and Gynecology

## 2016-11-29 LAB — CBC
HCT: 26.9 % — ABNORMAL LOW (ref 36.0–46.0)
HEMOGLOBIN: 9.1 g/dL — AB (ref 12.0–15.0)
MCH: 29.9 pg (ref 26.0–34.0)
MCHC: 33.8 g/dL (ref 30.0–36.0)
MCV: 88.5 fL (ref 78.0–100.0)
Platelets: 174 10*3/uL (ref 150–400)
RBC: 3.04 MIL/uL — AB (ref 3.87–5.11)
RDW: 15.4 % (ref 11.5–15.5)
WBC: 12.8 10*3/uL — ABNORMAL HIGH (ref 4.0–10.5)

## 2016-11-29 NOTE — Progress Notes (Signed)
Subjective: Postpartum Day 1: Cesarean Delivery Patient reports incisional pain, tolerating PO and no problems voiding.  Nl lochia, pain controlled  Objective: Vital signs in last 24 hours: Temp:  [97.9 F (36.6 C)-98.4 F (36.9 C)] 98.4 F (36.9 C) (05/13 0532) Pulse Rate:  [86-89] 88 (05/13 0532) Resp:  [18-20] 18 (05/13 0532) BP: (96-106)/(48-54) 97/53 (05/13 0532) SpO2:  [96 %] 96 % (05/12 2355)  Physical Exam:  General: alert and no distress Lochia: appropriate Uterine Fundus: firm Incision: healing well DVT Evaluation: No evidence of DVT seen on physical exam.   Recent Labs  11/27/16 0817 11/29/16 0521  HGB 11.5* 9.1*  HCT 34.8* 26.9*    Assessment/Plan: Status post Cesarean section. Doing well postoperatively.  Continue current care.  Lindsay Horn 11/29/2016, 9:10 AM

## 2016-11-30 MED ORDER — IBUPROFEN 800 MG PO TABS: 800.0000 mg | ORAL_TABLET | Freq: Three times a day (TID) | ORAL | 0 refills | Status: DC

## 2016-11-30 MED ORDER — ACETAMINOPHEN 325 MG PO TABS: 650.0000 mg | ORAL_TABLET | ORAL | 1 refills | Status: DC | PRN

## 2016-11-30 NOTE — Discharge Summary (Addendum)
OB Discharge Summary     Patient Name: Lindsay Horn DOB: [birth year] MRN: [id number]  Date of admission: 11/27/2016 Delivering MD: Janyth Contes   Date of discharge: 11/30/2016  Admitting diagnosis: INDUCTION Intrauterine pregnancy: [redacted]w[redacted]d     Secondary diagnosis:  Principal Problem:   S/P cesarean section Active Problems:   Term pregnancy  Additional problems:  Arrest of dilation     Discharge diagnosis: Term Pregnancy Delivered                                                                                                Post partum procedures:none  Augmentation: AROM and Pitocin  Complications:  Arrest of dilation  Hospital course:  Induction of Labor With Cesarean Section  31 y.o. yo [id number] at [redacted]w[redacted]d was admitted to the hospital 11/27/2016 for induction of labor. Patient had a labor course significant for arrest of dilation. The patient went for cesarean section due to Arrest of Dilation, and delivered a Viable infant,@BABYSUPPRESS (DBLINK,ept,110,,1,,) Membrane Rupture Time/Date: )10:25 AM ,11/27/2016   @Details  of operation can be found in separate operative Note.  Patient had an uncomplicated postpartum course. She is ambulating, tolerating a regular diet, passing flatus, and urinating well.  Patient is discharged home in stable condition on 11/30/16.                                    Physical exam  Vitals:   11/28/16 2355 11/29/16 0532 11/29/16 1753 11/30/16 0546  BP: (!) 96/54 (!) 97/53 108/79 (!) 107/52  Pulse: 86 88 97 81  Resp: 20 18 18 16   Temp: 98 F (36.7 C) 98.4 F (36.9 C) 98.4 F (36.9 C) 98.2 F (36.8 C)  TempSrc: Oral  Oral Oral  SpO2: 96%   98%  Weight:      Height:       General: alert and cooperative Lochia: appropriate Uterine Fundus: firm Incision: Dressing is clean, dry, and intact  Labs: Lab Results  Component Value Date   WBC 12.8 (H) 11/29/2016   HGB 9.1 (L) 11/29/2016   HCT 26.9 (L) 11/29/2016   MCV 88.5 11/29/2016   PLT 174 11/29/2016   CMP Latest Ref Rng & Units 07/20/2015  BUN 6 - 20 mg/dL 15  Creatinine 0.44 - 1.00 mg/dL 0.65  AST 15 - 41 U/L 27    Discharge instruction: per After Visit Summary and "Baby and Me Booklet".  After visit meds:  Allergies as of 11/30/2016      Reactions   Codeine Hives   Pt reports taking percocet in the past with no problems      Medication List    STOP taking these medications   DICLEGIS 10-10 MG Tbec Generic drug:  Doxylamine-Pyridoxine   pantoprazole 40 MG tablet Commonly known as:  PROTONIX     TAKE these medications   acetaminophen 325 MG tablet Commonly known as:  TYLENOL Take 2 tablets (650 mg total) by mouth every 4 (four) hours as needed (for pain scale < 4).   ibuprofen  800 MG tablet Commonly known as:  ADVIL,MOTRIN Take 1 tablet (800 mg total) by mouth every 8 (eight) hours.   loratadine 10 MG tablet Commonly known as:  CLARITIN Take 1 tablet by mouth daily.   prenatal multivitamin Tabs tablet Take 1 tablet by mouth daily at 12 noon.       Diet: routine diet  Activity: Advance as tolerated. Pelvic rest for 6 weeks.   Outpatient follow up:2 weeks Follow up Appt:No future appointments. Follow up Visit:No Follow-up on file.  Postpartum contraception: Undecided  Newborn Data: Live born female  Birth Weight: 7 lb 12 oz (3515 g) APGAR: 8, 9  Baby Feeding: Breast Disposition:home with mother   11/30/2016 Logan Bores, MD

## 2016-11-30 NOTE — Op Note (Signed)
NAME:  Lindsay Horn                    ACCOUNT NO.:  MEDICAL RECORD NO.:  [id number]  LOCATION:                                 FACILITY:  PHYSICIAN:  Thornell Sartorius, MD             DATE OF BIRTH:  DATE OF PROCEDURE:  11/28/2016 DATE OF DISCHARGE:                              OPERATIVE REPORT   PREOPERATIVE DIAGNOSIS:  Intrauterine pregnancy at term with arrest of dilatation.  POSTOPERATIVE DIAGNOSIS:  Intrauterine pregnancy at term with arrest of dilatation, status post low-transverse cesarean section for delivery.  PROCEDURE:  Low-transverse cesarean section.  SURGEON:  Thornell Sartorius, MD  FINDINGS:  Viable female infant at 2:04 a.m. with Apgars of 8 at 1 minute, 9 at 5 minutes and weight pending at the time of dictation.  Normal uterus, somewhat arcuate shaped, and no septum palpated.  Normal tubes and ovaries.  ANESTHESIA:  Epidural.  IV FLUIDS:  2900 mL.  URINE OUTPUT:  400 mL, clear urine at the end of procedure.  ESTIMATED BLOOD LOSS:  Approximately 900 mL.  COMPLICATIONS:  None.  PATHOLOGY:  Placenta to Path.  DESCRIPTION OF PROCEDURE:  After informed consent was reviewed with the patient and her family, she was transported to the OR where her epidural was dosed and found to be adequate.  She was on the table in supine position with a leftward tilt.  A Foley catheter has been previously placed.  She was prepped and draped in the normal sterile fashion after an appropriate time-out had been performed.  A Pfannenstiel skin incision was made at the level 2 fingerbreadths above her pubic symphysis, carried through the underlying layer of fascia sharply.  The fascia was incised in the midline.  The midline incision was extended laterally with Mayo scissors.  The superior aspect of the fascial incision was grasped with Kocher clamps, elevated, and rectus muscles were dissected off both bluntly and sharply.  Midline was easily identified and the peritoneum was entered  bluntly.  The Alexis retractor was placed, carefully making sure that no bowel was entrapped.  Bladder flap was created with sharp dissection.  Uterus was incised in a transverse fashion and the infant was delivered from vertex presentation.  Nose and mouth were suctioned on the field.  Cord was clamped and cut after waiting a minute.  The placenta was expressed from the uterus.  The uterus was cleared of all clot and debris.  The uterine incision was closed with 2 layers of 0 Monocryl, first of which was running locked and second as an imbricating layer.  The gutters were cleared of all clot and debris.  The tubes and ovaries were inspected and found to be normal.  Peritoneum was reapproximated with 2-0 Vicryl after the Alexis retractor was removed.  Subfascial planes were inspected and found to be hemostatic.  The fascia was closed with a single suture of 0 Vicryl.  Subcuticular adipose layer was made hemostatic with Bovie cautery.  The dead space was closed with 2-0 plain gut.  The skin was closed with 4-0 Vicryl in a subcuticular fashion on a Keith needle.  Benzoin and Steri-Strips  were applied.  The patient tolerated the procedure well.  Sponge, lap, and needle count was correct x2 per the operating room staff.     Thornell Sartorius, MD     JB/MEDQ  D:  11/28/2016  T:  11/28/2016  Job:  [id number]

## 2016-11-30 NOTE — Progress Notes (Signed)
Subjective: Postpartum Day 2 Cesarean Delivery Patient reports tolerating PO and no problems voiding.  Doing well and wants to go home!  Objective: Vital signs in last 24 hours: Temp:  [98.2 F (36.8 C)-98.4 F (36.9 C)] 98.2 F (36.8 C) (05/14 0546) Pulse Rate:  [81-97] 81 (05/14 0546) Resp:  [16-18] 16 (05/14 0546) BP: (107-108)/(52-79) 107/52 (05/14 0546) SpO2:  [98 %] 98 % (05/14 0546)  Physical Exam:  General: alert and cooperative Lochia: appropriate Uterine Fundus: firm Incision: C/D/I Recent Labs  11/29/16 0521  HGB 9.1*  HCT 26.9*    Assessment/Plan: Status post Cesarean section. Doing well postoperatively.  Discharge home with standard precautions and return to office in 2 weeks.  Logan Bores 11/30/2016, 9:17 AM

## 2016-11-30 NOTE — Lactation Note (Signed)
This note was copied from a baby's chart. Lactation Consultation Note New mom has large pendulum breast w/nipple at the bottom end of breast. Mom was trying to get baby back onto breast. Baby had came off of breast a few minutes prior to [facility] coming in. Noted baby sucking on top lip. Attempted to stop lip sucking and re-latch, unable to. Noted colostrum leaking from Lt. Breast. Hand expressed 20 ml colostrum. Syring fed w/gloved finger. Moms flat nipples tender, Lt. Nipple cracked, and red. Rt. Nipple red. Nipples very compressible. Shells given to wear in bra in am. Comfort gels given. Mom put on w/shirt. Discussed positioning/latching options, and props. Encouraged mom to be comfortable. Hand pump given to pre-pump nipple before latching.  Mom plans on exclusively for 6 months.  Mom has NS, size #20 and #24. Mom states will not stay on, has difficulty applying, baby doesn't like them. D/t nipple at bottom of large breast, mom isn't a candidate for NS. Mom wants to latch to breast. Discuss the importance of deep latch. Encouraged mom to call for assistance latching.   Patient Name: Lindsay Horn [id number] Date: 11/30/2016 Reason for consult: Follow-up assessment;Breast/nipple pain   Maternal Data    Feeding Feeding Type: Breast Milk Length of feed: 7 min  LATCH Score/Interventions Latch: Repeated attempts needed to sustain latch, nipple held in mouth throughout feeding, stimulation needed to elicit sucking reflex. Intervention(s): Adjust position;Assist with latch;Breast massage;Breast compression  Audible Swallowing: A few with stimulation Intervention(s): Skin to skin;Hand expression Intervention(s): Alternate breast massage  Type of Nipple: Flat Intervention(s): Shells;Hand pump  Comfort (Breast/Nipple): Engorged, cracked, bleeding, large blisters, severe discomfort Problem noted: Cracked, bleeding, blisters, bruises Intervention(s): Expressed breast milk to nipple;Hand pump     Hold (Positioning): Assistance needed to correctly position infant at breast and maintain latch. Intervention(s): Breastfeeding basics reviewed;Support Pillows;Position options;Skin to skin  LATCH Score: 4  Lactation Tools Discussed/Used Tools: Shells;Pump;Comfort gels Shell Type: Inverted Breast pump type: Manual Pump Review: Setup, frequency, and cleaning;Milk Storage Initiated by:: Allayne Stack RN IBCLC Date initiated:: 11/30/16   Consult Status Consult Status: Follow-up Date: 11/30/16 (in pm) Follow-up type: In-patient    Theodoro Kalata 11/30/2016, 4:19 AM

## 2016-11-30 NOTE — Lactation Note (Addendum)
This note was copied from a baby's chart. Lactation Consultation Note  Baby 21 hours old and sleeping on mother's chest. Attempted to wake to help w/ breastfeeding but baby still sleepy. Mother recently hand expressed and pumped approx 10 ml of colostrum. Mom has LC# to call for assist w/next feeding. Mother states her nipples are sore. Discussed that if baby is breastfeeding but having short feedings she should post pump w/ DEBP 4-5 times a day. If baby is not sustaining latch for more than 10 min she will need to pump both breasts q3 hours to help establish her milk supply. Give baby back milk pumped at next feeding. Mother is not using NS but is wearing shells. Parents are currently finger syringe feeding.  Suggest if desired they can use slow flow nipple bottle for supplementation at home. Set up OP for Thurs. 5/17 at 8:30a. Mother had personal DEBP at home.   Parents called for assistance. Mother has healing abrasions on tips.  Recommend ebm and coconut oil. Mother wearing shells. Mother latched baby in football hold.  Worked on depth using breast compression. Baby slips down to nipple during feeding. Re-latched baby and taught how to achieve depth. Also discussed and demonstrated other bf positions. Post pumping plan in place and OP set. Reviewed engorgement care and monitoring voids/stools.    Patient Name: Boy Aracelia Brinson KGYJE'H Date: 11/30/2016 Reason for consult: Follow-up assessment   Maternal Data    Feeding Feeding Type: Breast Fed Length of feed: 7 min  LATCH Score/Interventions Latch: Grasps breast easily, tongue down, lips flanged, rhythmical sucking. Intervention(s): Assist with latch;Adjust position;Breast massage;Breast compression  Audible Swallowing: A few with stimulation Intervention(s): Skin to skin;Hand expression Intervention(s): Hand expression  Type of Nipple: Everted at rest and after stimulation Intervention(s): Shells;Hand  pump  Comfort (Breast/Nipple): Engorged, cracked, bleeding, large blisters, severe discomfort Problem noted: Cracked, bleeding, blisters, bruises Intervention(s): Expressed breast milk to nipple;Hand pump  Problem noted: Cracked, bleeding, blisters, bruises;Mild/Moderate discomfort Interventions  (Cracked/bleeding/bruising/blister): Hand pump Interventions (Mild/moderate discomfort): Hand massage;Hand expression;Post-pump;Breast shields;Comfort gels  Hold (Positioning): No assistance needed to correctly position infant at breast. Intervention(s): Support Pillows  LATCH Score: 7  Lactation Tools Discussed/Used     Consult Status Consult Status: Follow-up Date: 12/03/16 Follow-up type: In-patient    Vivianne Master [facility] 11/30/2016, 9:20 AM

## 2016-12-01 ENCOUNTER — Inpatient Hospital Stay (HOSPITAL_COMMUNITY)
Admission: AD | Admit: 2016-12-01 | Payer: BC Managed Care – PPO | Source: Ambulatory Visit | Admitting: Obstetrics and Gynecology

## 2016-12-03 ENCOUNTER — Ambulatory Visit: Payer: Self-pay

## 2016-12-03 NOTE — Lactation Note (Addendum)
This note was copied from a baby's chart. Lactation Consult  Mother's reason for visit:  Feeding Assessment, Difficulty latching Visit Type:  Outpatient Appointment Notes:  Baby has been using #24NS to latch.  Mother's nipples are healing.  Has small abrasions on tips.  Noted when baby latched with NS baby was not to the base of NS.  Mother has a good milk supply.  Yesterday at 71 days old, she pumped 5 oz.  Praised mother for her efforts.  Taught mother how to latch baby without NS.  Mother states it does not hurt to latch without NS.  Explained because baby is able to latch deeper without NS. Baby is still sleepy at the breast and falls asleep before latching.  Recommend she pump or hand express of approx 10 ml before latching and allow baby to breastfeed until breast is soft then switch to the other breast.  Post pump every other day and supplement using slow flow nipple since mother is going back to work.  Mother no longer needs to use nipple shield.  Consult:  Initial Lactation Consultant:  Carlye Grippe  ________________________________________________________________________ Lindsay Horn Name:  Lindsay Horn Date of Birth:  07/09/1986 Pediatrician:  Jacklynn Ganong Gender:  female Gestational Age: <None> (At Birth) Birth Weight:    Weight at Discharge:  Weight: 4640 oz                      Date of Discharge:  11/30/2016    Filed Weights   11/27/16 0741  Weight: 4640 oz  Last weight taken from location outside of [facility]:  7 lb 7.5oz     Location:Pediatrician's office Weight today:  7 lb 9.1 oz. ________________________________________________________________________  Mother's Name: Lindsay Horn Type of delivery:  C-Section, Low Transverse Breastfeeding Experience:  primip Maternal Medications:  PNV, Motrin  ________________________________________________________________________  Breastfeeding History (Post Discharge)  Frequency of breastfeeding:  approx 1-3  hours usually every 2.5 hours Duration of feeding:  15-20 min    Pumping  Type of pump:  Medela pump in style Frequency:  1 time every other day Volume:  6-7 oz  Infant Intake and Output Assessment  Voids:  5 in 24 hrs.  Color:  Clear yellow Stools:  7 in 24 hrs.  Color:  Yellow  ________________________________________________________________________  Maternal Breast Assessment  Breast:  Full Nipple:  Erect Pain level:  0 Pain interventions:  Breast pump  _______________________________________________________________________ Feeding Assessment/Evaluation  Initial feeding assessment:  Infant's oral assessment:  WNL  Positioning:  Football Right breast  LATCH documentation:  Latch:  2 = Grasps breast easily, tongue down, lips flanged, rhythmical sucking.  Audible swallowing:  1 = A few with stimulation  Type of nipple:  2 = Everted at rest and after stimulation  Comfort (Breast/Nipple):  2 = Soft / non-tender  Hold (Positioning):  1 = Assistance needed to correctly position infant at breast and maintain latch  LATCH score:  8  Attached assessment:  Shallow  Lips flanged:  Yes.    Lips untucked:  Yes.    Suck assessment:  Displays both  Tools:  Nipple shield 24 mm Instructed on use and cleaning of tool:  Yes.    Pre-feed weight:  3434 g  (7 lb. 6 oz.) Post-feed weight:  3454 g (7  lb. 6.1 oz.) Amount transferred:  20 ml  Additional Feeding Assessment -   Infant's oral assessment:  WNL  Positioning:  Football Left breast  LATCH documentation:  Latch:  2 = Grasps breast easily, tongue down, lips flanged, rhythmical sucking.  Audible swallowing:  1 = A few with stimulation  Type of nipple:  2 = Everted at rest and after stimulation  Comfort (Breast/Nipple):  2 = Soft / non-tender  Hold (Positioning):  1 = Assistance needed to correctly position infant at breast and maintain latch  LATCH score:  8  Attached assessment:  Deep  Lips flanged:   No.  Lips untucked:  No.  Suck assessment:  Displays both  Pre-feed weight:  3454 g  (7 lb. 6.1 oz.) Post-feed weight:  3588 g (7 lb. 9.1 oz.) Amount transferred:  134 ml Amount supplemented:  0 ml   Total amount pumped post feed:  R 20 ml    L 134 ml  Total amount transferred:  154 ml Total supplement given:  0 ml

## 2016-12-19 NOTE — Addendum Note (Signed)
Addendum  created 12/19/16 0959 by Duane Boston, MD   Sign clinical note

## 2019-09-27 DIAGNOSIS — K219 Gastro-esophageal reflux disease without esophagitis: Secondary | ICD-10-CM | POA: Insufficient documentation

## 2020-04-03 ENCOUNTER — Encounter (HOSPITAL_COMMUNITY): Payer: Self-pay | Admitting: Obstetrics and Gynecology

## 2020-04-03 ENCOUNTER — Other Ambulatory Visit: Payer: Self-pay

## 2020-04-03 ENCOUNTER — Inpatient Hospital Stay (HOSPITAL_COMMUNITY)
Admission: AD | Admit: 2020-04-03 | Discharge: 2020-04-06 | DRG: 787 | Disposition: A | Discharge status: Home / Self Care | Payer: BC Managed Care – PPO | Attending: Obstetrics and Gynecology | Admitting: Obstetrics and Gynecology

## 2020-04-03 DIAGNOSIS — O34211 Maternal care for low transverse scar from previous cesarean delivery: Principal | ICD-10-CM | POA: Diagnosis present

## 2020-04-03 DIAGNOSIS — O99214 Obesity complicating childbirth: Secondary | ICD-10-CM | POA: Diagnosis present

## 2020-04-03 DIAGNOSIS — O4290 Premature rupture of membranes, unspecified as to length of time between rupture and onset of labor, unspecified weeks of gestation: Secondary | ICD-10-CM

## 2020-04-03 DIAGNOSIS — O26893 Other specified pregnancy related conditions, third trimester: Secondary | ICD-10-CM | POA: Diagnosis present

## 2020-04-03 DIAGNOSIS — Z3A38 38 weeks gestation of pregnancy: Secondary | ICD-10-CM | POA: Diagnosis not present

## 2020-04-03 DIAGNOSIS — Z20822 Contact with and (suspected) exposure to covid-19: Secondary | ICD-10-CM | POA: Diagnosis present

## 2020-04-03 DIAGNOSIS — O4202 Full-term premature rupture of membranes, onset of labor within 24 hours of rupture: Secondary | ICD-10-CM

## 2020-04-03 DIAGNOSIS — Z98891 History of uterine scar from previous surgery: Secondary | ICD-10-CM

## 2020-04-03 DIAGNOSIS — E669 Obesity, unspecified: Secondary | ICD-10-CM | POA: Diagnosis present

## 2020-04-03 HISTORY — DX: Premature rupture of membranes, unspecified as to length of time between rupture and onset of labor, unspecified weeks of gestation: O42.90

## 2020-04-03 LAB — CBC
HCT: 36.3 % (ref 36.0–46.0)
Hemoglobin: 11.4 g/dL — ABNORMAL LOW (ref 12.0–15.0)
MCH: 28.6 pg (ref 26.0–34.0)
MCHC: 31.4 g/dL (ref 30.0–36.0)
MCV: 91.2 fL (ref 80.0–100.0)
Platelets: 173 K/uL (ref 150–400)
RBC: 3.98 MIL/uL (ref 3.87–5.11)
RDW: 14.1 % (ref 11.5–15.5)
WBC: 8 K/uL (ref 4.0–10.5)
nRBC: 0 % (ref 0.0–0.2)

## 2020-04-03 LAB — TYPE AND SCREEN
ABO/RH(D): A POS
Antibody Screen: NEGATIVE

## 2020-04-03 LAB — RPR: RPR Ser Ql: NONREACTIVE

## 2020-04-03 LAB — SARS CORONAVIRUS 2 BY RT PCR (HOSPITAL ORDER, PERFORMED IN ~~LOC~~ HOSPITAL LAB): SARS Coronavirus 2: NEGATIVE

## 2020-04-03 MED ORDER — FLEET ENEMA 7-19 GM/118ML RE ENEM: 1.0000 | ENEMA | Freq: Every day | RECTAL | Status: DC | PRN

## 2020-04-03 MED ORDER — FENTANYL-BUPIVACAINE-NACL 0.5-0.125-0.9 MG/250ML-% EP SOLN
12.0000 mL/h | EPIDURAL | Status: DC | PRN
  Filled 2020-04-03: qty 250

## 2020-04-03 MED ORDER — ONDANSETRON HCL 4 MG/2ML IJ SOLN
4.0000 mg | Freq: Four times a day (QID) | INTRAMUSCULAR | Status: DC | PRN
  Administered 2020-04-03 – 2020-04-04 (×2): 4 mg via INTRAVENOUS
  Filled 2020-04-03 (×2): qty 2

## 2020-04-03 MED ORDER — EPHEDRINE 5 MG/ML INJ: 10.0000 mg | INTRAVENOUS | Status: DC | PRN

## 2020-04-03 MED ORDER — LACTATED RINGERS IV SOLN: 500.0000 mL | INTRAVENOUS | Status: DC | PRN

## 2020-04-03 MED ORDER — LIDOCAINE HCL (PF) 1 % IJ SOLN: 30.0000 mL | INTRAMUSCULAR | Status: DC | PRN

## 2020-04-03 MED ORDER — OXYTOCIN BOLUS FROM INFUSION: 333.0000 mL | Freq: Once | INTRAVENOUS | Status: DC

## 2020-04-03 MED ORDER — LACTATED RINGERS IV SOLN: INTRAVENOUS | Status: DC

## 2020-04-03 MED ORDER — OXYTOCIN-SODIUM CHLORIDE 30-0.9 UT/500ML-% IV SOLN
1.0000 m[IU]/min | INTRAVENOUS | Status: DC
  Administered 2020-04-03: 2 m[IU]/min via INTRAVENOUS
  Filled 2020-04-03: qty 500

## 2020-04-03 MED ORDER — PHENYLEPHRINE 40 MCG/ML (10ML) SYRINGE FOR IV PUSH (FOR BLOOD PRESSURE SUPPORT)
80.0000 ug | PREFILLED_SYRINGE | INTRAVENOUS | Status: DC | PRN
  Filled 2020-04-03: qty 10

## 2020-04-03 MED ORDER — TERBUTALINE SULFATE 1 MG/ML IJ SOLN: 0.2500 mg | Freq: Once | INTRAMUSCULAR | Status: DC | PRN

## 2020-04-03 MED ORDER — OXYTOCIN-SODIUM CHLORIDE 30-0.9 UT/500ML-% IV SOLN: 2.5000 [IU]/h | INTRAVENOUS | Status: DC

## 2020-04-03 MED ORDER — ONDANSETRON HCL 4 MG/2ML IJ SOLN
INTRAMUSCULAR | Status: AC
  Administered 2020-04-03: 4 mg
  Filled 2020-04-03: qty 2

## 2020-04-03 MED ORDER — ACETAMINOPHEN 325 MG PO TABS: 650.0000 mg | ORAL_TABLET | ORAL | Status: DC | PRN

## 2020-04-03 MED ORDER — LACTATED RINGERS IV SOLN
500.0000 mL | Freq: Once | INTRAVENOUS | Status: AC
  Administered 2020-04-04: 500 mL via INTRAVENOUS

## 2020-04-03 MED ORDER — PHENYLEPHRINE 40 MCG/ML (10ML) SYRINGE FOR IV PUSH (FOR BLOOD PRESSURE SUPPORT)
80.0000 ug | PREFILLED_SYRINGE | INTRAVENOUS | Status: DC | PRN
  Administered 2020-04-04 (×2): 80 ug via INTRAVENOUS

## 2020-04-03 MED ORDER — DIPHENHYDRAMINE HCL 50 MG/ML IJ SOLN: 12.5000 mg | INTRAMUSCULAR | Status: DC | PRN

## 2020-04-03 MED ORDER — SOD CITRATE-CITRIC ACID 500-334 MG/5ML PO SOLN
30.0000 mL | ORAL | Status: DC | PRN
  Administered 2020-04-04: 30 mL via ORAL
  Filled 2020-04-03: qty 30

## 2020-04-03 NOTE — MAU Note (Signed)
Pt presents with complaint of ROM at 0320, reports ? Contractions, reports good fetal movement.

## 2020-04-03 NOTE — Progress Notes (Signed)
Patient ID: Lindsay Horn, female   DOB: 02-28-1986, 34 y.o.   MRN: [id number]  Feeling some contractions.  Continued LOF  AFVSS gen NAD FHTs 130-140's, mod var, + accels, occ variables, category 1 toco Occ/int  SVE 3.5/70/-2  Continue IOL - pitocin at 18mu/min  34yo G4P1021 at 39 w SROM Continue augmentation Epidural prn

## 2020-04-03 NOTE — MAU Provider Note (Signed)
  Pt awaiting a room.  Holding on L&D and MB.  AFVSS gen NAD FHTs 130's, mod var, + accels toco irregular  SVE 3/70/-2-3  Anticipate transfer to L&D soon Will augment labor

## 2020-04-03 NOTE — H&P (Signed)
Lindsay Horn is a 34 y.o. female [phone] at 90+ with ROM ,confirmed in MAU.  [name], clear and large at 4-6:96EX.  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  Pt with N/V through pregnancy.     OB History    Gravida  4   Para  1   Term  1   Preterm  0   AB  2   Living  1     SAB  2   TAB  0   Ectopic  0   Multiple  0   Live Births  1         G1 SAB G2 2nd trimester SAB, followed by septum resection x 2 G3 LTCS 39+ 7#12 female Lindsay Horn) G4 present  No abn pap No STD  Past Medical History:  Diagnosis Date  . 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 Fetal demise, less than 22 weeks 12/11/2014  . Medical history non-contributory   . S/P cesarean section 11/28/2016   Past Surgical History:  Procedure Laterality Date  . APPENDECTOMY    . APPENDECTOMY  2003  . CESAREAN SECTION N/A 11/28/2016   Procedure: CESAREAN SECTION;  Surgeon: Lindsay Contes, MD;  Location: [facility];  Service: Obstetrics;  Laterality: N/A;  . DILATION AND EVACUATION N/A 12/12/2014   Procedure: DILATATION AND EVACUATION (D&E) 2ND TRIMESTER with ultrasound guidance;  Surgeon: Lindsay Contes, MD;  Location: [facility];  Service: Gynecology;  Laterality: N/A;  . HYSTEROSCOPY     removal of uterine septum  . WISDOM TOOTH EXTRACTION  2003   Family History: breast cancer, ovarian cancer, bicornuate uterus Social History:  reports that she has never smoked. She has never used smokeless tobacco. She reports current alcohol use of about 4.0 standard drinks of alcohol per week. She reports that she does not use drugs. No ETOH use in pregnancy.  Teacher, married  Meds Protonix, PNV All Codeine, egg sensitivity     Maternal Diabetes: No Genetic Screening: Normal Maternal Ultrasounds/Referrals: Normal Fetal  Ultrasounds or other Referrals:  None Maternal Substance Abuse:  No Significant Maternal Medications:  None Significant Maternal Lab Results:  Group B Strep negative Other Comments:  None  Review of Systems  Constitutional: Negative.   HENT: Negative.   Eyes: Negative.   Respiratory: Negative.   Cardiovascular: Negative.   Gastrointestinal: Negative.   Genitourinary: Negative.   Musculoskeletal: Positive for back pain.  Skin: Negative.   Neurological: Negative.   Psychiatric/Behavioral: Negative.    Maternal Medical History:  Reason for admission: Rupture of membranes.   Contractions: Onset was 13-24 hours ago.    Fetal activity: Perceived fetal activity is normal.    Prenatal complications: Obesity, h/o LTCS, h/o inferility  Prenatal Complications - Diabetes: none.      Blood pressure (!) 126/58, pulse (!) 110, temperature 98.4 F (36.9 C), temperature source Oral, resp. rate 17, height 5\' 4"  (1.626 m), weight 132.5 kg, last menstrual period 07/07/2019, SpO2 99 %, unknown if currently breastfeeding. Maternal Exam:  Uterine Assessment: Contraction strength is mild.  Contraction frequency is irregular.   Abdomen: Patient reports no abdominal tenderness. Surgical scars: low transverse.   Fundal height is appropriate for gestation.   Estimated fetal weight is 7.5-8.5#.   Fetal presentation: vertex  Introitus: Normal vulva. Normal vagina.  Ferning  test: positive.  Nitrazine test: positive. Amniotic fluid character: clear.     Physical Exam Constitutional:      Appearance: Normal appearance.  HENT:     Head: Normocephalic and atraumatic.  Cardiovascular:     Rate and Rhythm: Normal rate and regular rhythm.  Pulmonary:     Effort: Pulmonary effort is normal.     Breath sounds: Normal breath sounds.  Abdominal:     General: Bowel sounds are normal.     Palpations: Abdomen is soft.     Comments: Obese, gravid  Genitourinary:    General: Normal vulva.   Musculoskeletal:        General: Swelling present. Normal range of motion.     Cervical back: Normal range of motion and neck supple.  Skin:    General: Skin is warm and dry.  Neurological:     General: No focal deficit present.     Mental Status: She is alert and oriented to person, place, and time.  Psychiatric:        Mood and Affect: Mood normal.        Behavior: Behavior normal.     Prenatal labs: ABO, Rh: --/--/A POS (09/15 4163) Antibody: NEG (09/15 0556) Rubella:  immune RPR:   NR HBsAg:   neg HIV:   neg GBS:   neg  Hgb 12.1/Plt 206/Ur Cx neg/GC neg/Chl neg/Varicella immune/Hgb electro WNL/HCV neg/glucola 158 - nl 3 hr GTT/ Panorma low risk/   [place] nl anat - limited, post plac [place] completes nl anat  Assessment/Plan: 34yo A4T3646 at 38+ with ROM Confirmed in MAU Admit to L&D Augment with pitocin IV pain meds and/or epidural prn Expect SVD D/W pt r/b/a and process for TOLAC   Lindsay Horn 04/03/2020, 8:01 AM

## 2020-04-03 NOTE — MAU Provider Note (Signed)
  FHTs 140-150's mod var, + accels, category 1 toco irr

## 2020-04-04 ENCOUNTER — Inpatient Hospital Stay (HOSPITAL_COMMUNITY): Payer: BC Managed Care – PPO | Admitting: Anesthesiology

## 2020-04-04 ENCOUNTER — Encounter (HOSPITAL_COMMUNITY)
Admission: AD | Disposition: A | Discharge status: Home / Self Care | Payer: Self-pay | Source: Home / Self Care | Attending: Obstetrics and Gynecology

## 2020-04-04 ENCOUNTER — Encounter (HOSPITAL_COMMUNITY): Payer: Self-pay | Admitting: Obstetrics and Gynecology

## 2020-04-04 DIAGNOSIS — Z98891 History of uterine scar from previous surgery: Secondary | ICD-10-CM

## 2020-04-04 HISTORY — DX: History of uterine scar from previous surgery: Z98.891

## 2020-04-04 LAB — CBC
HCT: 34.2 % — ABNORMAL LOW (ref 36.0–46.0)
HCT: 31.4 % — ABNORMAL LOW (ref 36.0–46.0)
Hemoglobin: 10.9 g/dL — ABNORMAL LOW (ref 12.0–15.0)
Hemoglobin: 9.9 g/dL — ABNORMAL LOW (ref 12.0–15.0)
MCH: 29.2 pg (ref 26.0–34.0)
MCH: 28.9 pg (ref 26.0–34.0)
MCHC: 31.9 g/dL (ref 30.0–36.0)
MCHC: 31.5 g/dL (ref 30.0–36.0)
MCV: 91.7 fL (ref 80.0–100.0)
MCV: 91.8 fL (ref 80.0–100.0)
Platelets: 150 10*3/uL (ref 150–400)
Platelets: 154 10*3/uL (ref 150–400)
RBC: 3.73 MIL/uL — ABNORMAL LOW (ref 3.87–5.11)
RBC: 3.42 MIL/uL — ABNORMAL LOW (ref 3.87–5.11)
RDW: 13.9 % (ref 11.5–15.5)
RDW: 13.8 % (ref 11.5–15.5)
WBC: 12.5 10*3/uL — ABNORMAL HIGH (ref 4.0–10.5)
WBC: 13.5 10*3/uL — ABNORMAL HIGH (ref 4.0–10.5)
nRBC: 0 % (ref 0.0–0.2)
nRBC: 0 % (ref 0.0–0.2)

## 2020-04-04 LAB — CREATININE, SERUM
Creatinine, Ser: 0.71 mg/dL (ref 0.44–1.00)
GFR calc Af Amer: >60 mL/min (ref >60)
GFR calc non Af Amer: >60 mL/min (ref >60)

## 2020-04-04 SURGERY — Surgical Case
Anesthesia: Epidural

## 2020-04-04 MED ORDER — DEXAMETHASONE SODIUM PHOSPHATE 10 MG/ML IJ SOLN
INTRAMUSCULAR | Status: DC | PRN
  Administered 2020-04-04: 10 mg via INTRAVENOUS

## 2020-04-04 MED ORDER — NALBUPHINE HCL 10 MG/ML IJ SOLN: 5.0000 mg | Freq: Once | INTRAMUSCULAR | Status: DC | PRN

## 2020-04-04 MED ORDER — LORATADINE 10 MG PO TABS
10.0000 mg | ORAL_TABLET | Freq: Every day | ORAL | Status: DC
  Administered 2020-04-04 – 2020-04-05 (×2): 10 mg via ORAL
  Filled 2020-04-04 (×3): qty 1

## 2020-04-04 MED ORDER — NALOXONE HCL 4 MG/10ML IJ SOLN
1.0000 ug/kg/h | INTRAVENOUS | Status: DC | PRN
  Filled 2020-04-04: qty 5

## 2020-04-04 MED ORDER — CEFAZOLIN SODIUM-DEXTROSE 2-3 GM-%(50ML) IV SOLR
INTRAVENOUS | Status: DC | PRN
  Administered 2020-04-04 (×2): 2 g via INTRAVENOUS

## 2020-04-04 MED ORDER — SIMETHICONE 80 MG PO CHEW
80.0000 mg | CHEWABLE_TABLET | Freq: Three times a day (TID) | ORAL | Status: DC
  Administered 2020-04-04 – 2020-04-06 (×7): 80 mg via ORAL
  Filled 2020-04-04 (×8): qty 1

## 2020-04-04 MED ORDER — SIMETHICONE 80 MG PO CHEW: 80.0000 mg | CHEWABLE_TABLET | ORAL | Status: DC | PRN

## 2020-04-04 MED ORDER — PHENYLEPHRINE HCL (PRESSORS) 10 MG/ML IV SOLN
INTRAVENOUS | Status: DC | PRN
  Administered 2020-04-04 (×2): 40 ug via INTRAVENOUS

## 2020-04-04 MED ORDER — PHENYLEPHRINE HCL-NACL 20-0.9 MG/250ML-% IV SOLN
INTRAVENOUS | Status: DC | PRN
  Administered 2020-04-04: 30 ug/min via INTRAVENOUS

## 2020-04-04 MED ORDER — SODIUM CHLORIDE 0.9 % IV SOLN: INTRAVENOUS | Status: DC | PRN

## 2020-04-04 MED ORDER — LACTATED RINGERS IV SOLN: INTRAVENOUS | Status: DC | PRN

## 2020-04-04 MED ORDER — NALBUPHINE HCL 10 MG/ML IJ SOLN: 5.0000 mg | INTRAMUSCULAR | Status: DC | PRN

## 2020-04-04 MED ORDER — SENNOSIDES-DOCUSATE SODIUM 8.6-50 MG PO TABS
2.0000 | ORAL_TABLET | ORAL | Status: DC
  Administered 2020-04-04 – 2020-04-05 (×2): 2 via ORAL
  Filled 2020-04-04 (×2): qty 2

## 2020-04-04 MED ORDER — DIPHENHYDRAMINE HCL 25 MG PO CAPS
25.0000 mg | ORAL_CAPSULE | ORAL | Status: DC | PRN
  Filled 2020-04-04: qty 1

## 2020-04-04 MED ORDER — PRENATAL MULTIVITAMIN CH: 1.0000 | ORAL_TABLET | Freq: Every day | ORAL | Status: DC

## 2020-04-04 MED ORDER — OXYTOCIN-SODIUM CHLORIDE 30-0.9 UT/500ML-% IV SOLN
2.5000 [IU]/h | INTRAVENOUS | Status: AC
  Administered 2020-04-04: 2.5 [IU]/h via INTRAVENOUS
  Filled 2020-04-04: qty 500

## 2020-04-04 MED ORDER — DIBUCAINE (PERIANAL) 1 % EX OINT: 1.0000 "application " | TOPICAL_OINTMENT | CUTANEOUS | Status: DC | PRN

## 2020-04-04 MED ORDER — OXYCODONE HCL 5 MG PO TABS
5.0000 mg | ORAL_TABLET | ORAL | Status: DC | PRN
  Administered 2020-04-06 (×2): 5 mg via ORAL
  Filled 2020-04-04 (×2): qty 1

## 2020-04-04 MED ORDER — ACETAMINOPHEN 500 MG PO TABS
1000.0000 mg | ORAL_TABLET | Freq: Four times a day (QID) | ORAL | Status: DC
  Administered 2020-04-04 – 2020-04-05 (×4): 1000 mg via ORAL
  Filled 2020-04-04 (×4): qty 2

## 2020-04-04 MED ORDER — ZOLPIDEM TARTRATE 5 MG PO TABS: 5.0000 mg | ORAL_TABLET | Freq: Every evening | ORAL | Status: DC | PRN

## 2020-04-04 MED ORDER — VITAMIN B-6 100 MG PO TABS
100.0000 mg | ORAL_TABLET | Freq: Every day | ORAL | Status: DC
  Filled 2020-04-04 (×3): qty 1

## 2020-04-04 MED ORDER — FENTANYL CITRATE (PF) 100 MCG/2ML IJ SOLN
INTRAMUSCULAR | Status: DC | PRN
Start: 2020-04-04 — End: 2020-04-04
  Administered 2020-04-04: 100 ug via EPIDURAL

## 2020-04-04 MED ORDER — COCONUT OIL OIL: 1.0000 "application " | TOPICAL_OIL | Status: DC | PRN

## 2020-04-04 MED ORDER — LIDOCAINE-EPINEPHRINE 2 %-1:100000 IJ SOLN
INTRAMUSCULAR | Status: DC | PRN
  Administered 2020-04-04: 5 mL
  Administered 2020-04-04: 3 mL
  Administered 2020-04-04: 2 mL

## 2020-04-04 MED ORDER — DIPHENHYDRAMINE HCL 50 MG/ML IJ SOLN: 12.5000 mg | INTRAMUSCULAR | Status: DC | PRN

## 2020-04-04 MED ORDER — SODIUM CHLORIDE 0.9% FLUSH: 3.0000 mL | INTRAVENOUS | Status: DC | PRN

## 2020-04-04 MED ORDER — MENTHOL 3 MG MT LOZG: 1.0000 | LOZENGE | OROMUCOSAL | Status: DC | PRN

## 2020-04-04 MED ORDER — LIDOCAINE HCL (PF) 1 % IJ SOLN
INTRAMUSCULAR | Status: DC | PRN
  Administered 2020-04-04: 5 mL via EPIDURAL

## 2020-04-04 MED ORDER — SCOPOLAMINE 1 MG/3DAYS TD PT72
MEDICATED_PATCH | TRANSDERMAL | Status: AC
  Filled 2020-04-04: qty 1

## 2020-04-04 MED ORDER — MORPHINE SULFATE (PF) 0.5 MG/ML IJ SOLN
INTRAMUSCULAR | Status: DC | PRN
  Administered 2020-04-04: 3 mg via EPIDURAL

## 2020-04-04 MED ORDER — SIMETHICONE 80 MG PO CHEW
80.0000 mg | CHEWABLE_TABLET | ORAL | Status: DC
  Administered 2020-04-04: 80 mg via ORAL
  Filled 2020-04-04 (×2): qty 1

## 2020-04-04 MED ORDER — FENTANYL CITRATE (PF) 100 MCG/2ML IJ SOLN
INTRAMUSCULAR | Status: AC
  Filled 2020-04-04: qty 2

## 2020-04-04 MED ORDER — PRENATAL MULTIVITAMIN CH
1.0000 | ORAL_TABLET | Freq: Every day | ORAL | Status: DC
  Administered 2020-04-05 – 2020-04-06 (×2): 1 via ORAL
  Filled 2020-04-04 (×2): qty 1

## 2020-04-04 MED ORDER — PHENYLEPHRINE 40 MCG/ML (10ML) SYRINGE FOR IV PUSH (FOR BLOOD PRESSURE SUPPORT)
PREFILLED_SYRINGE | INTRAVENOUS | Status: AC
  Filled 2020-04-04: qty 10

## 2020-04-04 MED ORDER — NALOXONE HCL 0.4 MG/ML IJ SOLN: 0.4000 mg | INTRAMUSCULAR | Status: DC | PRN

## 2020-04-04 MED ORDER — METOCLOPRAMIDE HCL 5 MG/ML IJ SOLN
INTRAMUSCULAR | Status: DC | PRN
  Administered 2020-04-04: 10 mg via INTRAVENOUS

## 2020-04-04 MED ORDER — NALBUPHINE HCL 10 MG/ML IJ SOLN
5.0000 mg | INTRAMUSCULAR | Status: DC | PRN
  Administered 2020-04-04: 5 mg via SUBCUTANEOUS
  Filled 2020-04-04: qty 1

## 2020-04-04 MED ORDER — OXYTOCIN-SODIUM CHLORIDE 30-0.9 UT/500ML-% IV SOLN
INTRAVENOUS | Status: AC
  Filled 2020-04-04: qty 500

## 2020-04-04 MED ORDER — IBUPROFEN 800 MG PO TABS
800.0000 mg | ORAL_TABLET | Freq: Three times a day (TID) | ORAL | Status: DC
  Administered 2020-04-04 – 2020-04-05 (×4): 800 mg via ORAL
  Filled 2020-04-04 (×4): qty 1

## 2020-04-04 MED ORDER — LACTATED RINGERS IV BOLUS
500.0000 mL | Freq: Once | INTRAVENOUS | Status: AC
  Administered 2020-04-04: 500 mL via INTRAVENOUS

## 2020-04-04 MED ORDER — ONDANSETRON HCL 4 MG/2ML IJ SOLN
4.0000 mg | Freq: Three times a day (TID) | INTRAMUSCULAR | Status: DC | PRN
  Administered 2020-04-04: 4 mg via INTRAVENOUS
  Filled 2020-04-04: qty 2

## 2020-04-04 MED ORDER — ENOXAPARIN SODIUM 40 MG/0.4ML ~~LOC~~ SOLN
40.0000 mg | SUBCUTANEOUS | Status: DC
  Administered 2020-04-04 – 2020-04-05 (×2): 40 mg via SUBCUTANEOUS
  Filled 2020-04-04 (×2): qty 0.4

## 2020-04-04 MED ORDER — DIPHENHYDRAMINE HCL 25 MG PO CAPS
25.0000 mg | ORAL_CAPSULE | Freq: Four times a day (QID) | ORAL | Status: DC | PRN
  Administered 2020-04-04 (×2): 25 mg via ORAL
  Filled 2020-04-04: qty 1

## 2020-04-04 MED ORDER — ONDANSETRON HCL 4 MG/2ML IJ SOLN
INTRAMUSCULAR | Status: DC | PRN
  Administered 2020-04-04: 4 mg via INTRAVENOUS

## 2020-04-04 MED ORDER — SCOPOLAMINE 1 MG/3DAYS TD PT72: 1.0000 | MEDICATED_PATCH | Freq: Once | TRANSDERMAL | Status: DC

## 2020-04-04 MED ORDER — LACTATED RINGERS IV SOLN: INTRAVENOUS | Status: DC

## 2020-04-04 MED ORDER — OXYTOCIN-SODIUM CHLORIDE 30-0.9 UT/500ML-% IV SOLN
INTRAVENOUS | Status: DC | PRN
  Administered 2020-04-04: 30 [IU] via INTRAVENOUS

## 2020-04-04 MED ORDER — SODIUM CHLORIDE (PF) 0.9 % IJ SOLN
INTRAMUSCULAR | Status: DC | PRN
Start: 2020-04-04 — End: 2020-04-04
  Administered 2020-04-04: 12 mL/h via EPIDURAL

## 2020-04-04 MED ORDER — LACTATED RINGERS IV SOLN: Freq: Once | INTRAVENOUS | Status: AC

## 2020-04-04 MED ORDER — WITCH HAZEL-GLYCERIN EX PADS: 1.0000 "application " | MEDICATED_PAD | CUTANEOUS | Status: DC | PRN

## 2020-04-04 MED ORDER — MORPHINE SULFATE (PF) 0.5 MG/ML IJ SOLN
INTRAMUSCULAR | Status: AC
  Filled 2020-04-04: qty 10

## 2020-04-04 MED ORDER — DOXYLAMINE SUCCINATE (SLEEP) 25 MG PO TABS
25.0000 mg | ORAL_TABLET | Freq: Every evening | ORAL | Status: DC | PRN
  Filled 2020-04-04: qty 1

## 2020-04-04 SURGICAL SUPPLY — 38 items
APL SKNCLS STERI-STRIP NONHPOA (GAUZE/BANDAGES/DRESSINGS) ×1
BENZOIN TINCTURE PRP APPL 2/3 (GAUZE/BANDAGES/DRESSINGS) ×2 IMPLANT
CHLORAPREP W/TINT 26ML (MISCELLANEOUS) ×2 IMPLANT
CLAMP CORD UMBIL (MISCELLANEOUS) IMPLANT
CLOSURE STERI STRIP 1/2 X4 (GAUZE/BANDAGES/DRESSINGS) ×1 IMPLANT
CLOTH BEACON ORANGE TIMEOUT ST (SAFETY) ×2 IMPLANT
DRAPE C SECTION CLR SCREEN (DRAPES) ×1 IMPLANT
DRSG OPSITE POSTOP 4X10 (GAUZE/BANDAGES/DRESSINGS) ×2 IMPLANT
ELECT REM PT RETURN 9FT ADLT (ELECTROSURGICAL) ×2
ELECTRODE REM PT RTRN 9FT ADLT (ELECTROSURGICAL) ×1 IMPLANT
EXTRACTOR VACUUM KIWI (MISCELLANEOUS) ×1 IMPLANT
EXTRACTOR VACUUM M CUP 4 TUBE (SUCTIONS) IMPLANT
GLOVE BIO SURGEON STRL SZ 6.5 (GLOVE) ×2 IMPLANT
GLOVE BIOGEL PI IND STRL 7.0 (GLOVE) ×1 IMPLANT
GLOVE BIOGEL PI INDICATOR 7.0 (GLOVE) ×1
GOWN STRL REUS W/TWL LRG LVL3 (GOWN DISPOSABLE) ×4 IMPLANT
KIT ABG SYR 3ML LUER SLIP (SYRINGE) IMPLANT
NDL HYPO 25X5/8 SAFETYGLIDE (NEEDLE) IMPLANT
NEEDLE HYPO 25X5/8 SAFETYGLIDE (NEEDLE) IMPLANT
NS IRRIG 1000ML POUR BTL (IV SOLUTION) ×2 IMPLANT
PACK C SECTION WH (CUSTOM PROCEDURE TRAY) ×2 IMPLANT
PAD OB MATERNITY 4.3X12.25 (PERSONAL CARE ITEMS) ×2 IMPLANT
PENCIL SMOKE EVAC W/HOLSTER (ELECTROSURGICAL) ×2 IMPLANT
RTRCTR C-SECT PINK 25CM LRG (MISCELLANEOUS) ×2 IMPLANT
STRIP CLOSURE SKIN 1/2X4 (GAUZE/BANDAGES/DRESSINGS) ×2 IMPLANT
SUT MNCRL 0 VIOLET CTX 36 (SUTURE) ×2 IMPLANT
SUT MONOCRYL 0 CTX 36 (SUTURE) ×4
SUT PLAIN 1 NONE 54 (SUTURE) IMPLANT
SUT PLAIN 2 0 XLH (SUTURE) ×2 IMPLANT
SUT VIC AB 0 CT1 27 (SUTURE) ×4
SUT VIC AB 0 CT1 27XBRD ANBCTR (SUTURE) ×2 IMPLANT
SUT VIC AB 2-0 CT1 27 (SUTURE) ×2
SUT VIC AB 2-0 CT1 TAPERPNT 27 (SUTURE) ×1 IMPLANT
SUT VIC AB 4-0 KS 27 (SUTURE) ×2 IMPLANT
SYR BULB IRRIGATION 50ML (SYRINGE) ×2 IMPLANT
TOWEL OR 17X24 6PK STRL BLUE (TOWEL DISPOSABLE) ×2 IMPLANT
TRAY FOLEY W/BAG SLVR 14FR LF (SET/KITS/TRAYS/PACK) ×2 IMPLANT
WATER STERILE IRR 1000ML POUR (IV SOLUTION) ×3 IMPLANT

## 2020-04-04 NOTE — Progress Notes (Signed)
Spoke with Dr. Melba Coon regarding pt output. She is aware of the last output from her foley catheter which was 50 ml, LR is running at 125 ml/hr, and patient is drinking water. Dr. Melba Coon wants the catheter to stay in over night and will reassess in the morning during rounds. LR to continue to run at 125 ml/hr. Will continue to monitor.

## 2020-04-04 NOTE — Progress Notes (Signed)
Subjective: Postpartum Day 0: Cesarean Delivery Patient reports incisional pain and tolerating PO.  Itching.    Objective: Vital signs in last 24 hours: Temp:  [97.9 F (36.6 C)-98.7 F (37.1 C)] 98.7 F (37.1 C) (09/16 0615) Pulse Rate:  [81-123] 92 (09/16 0730) Resp:  [12-27] 19 (09/16 0730) BP: (70-135)/(34-79) 106/69 (09/16 0730) SpO2:  [95 %-100 %] 97 % (09/16 0730)  Physical Exam:  General: alert and no distress Lochia: appropriate Uterine Fundus: firm Incision: healing well DVT Evaluation: No evidence of DVT seen on physical exam.  Recent Labs    04/03/20 0556 04/04/20 0724  HGB 11.4* 10.9*  HCT 36.3 34.2*    Assessment/Plan: Status post Cesarean section. Doing well postoperatively.  Continue current care.  Lindsay Horn 04/04/2020, 7:58 AM

## 2020-04-04 NOTE — Brief Op Note (Signed)
04/04/2020  4:52 AM  PATIENT:  Lindsay Horn  34 y.o. female  PRE-OPERATIVE DIAGNOSIS:  elective repeat C-Section; arrest of dilation  POST-OPERATIVE DIAGNOSIS:  elective repeat C-Section; arrest of dilation  PROCEDURE:  Procedure(s) with comments: CESAREAN SECTION (N/A) - Tracey RNFA  SURGEON:  Surgeon(s) and Role:    * Bovard-Stuckert, Zayah Keilman, MD - Primary  ANESTHESIA:   epidural  EBL: 440cc, IVF and uop per anesthesia, clear, concentrated urine.    FINDINGS: viable female infant at 4:04am apgars 9/9, nl uterus, tubes and ovaries  BLOOD ADMINISTERED:none  DRAINS: Urinary Catheter (Foley)   LOCAL MEDICATIONS USED:  NONE  SPECIMEN:  Source of Specimen:  Placenta  DISPOSITION OF SPECIMEN:  L&D  COUNTS:  YES  TOURNIQUET:  * No tourniquets in log *  DICTATION: .Other Dictation: Dictation Number [id number]  PLAN OF CARE: Admit to inpatient   PATIENT DISPOSITION:  PACU - hemodynamically stable.   Delay start of Pharmacological VTE agent (>24hrs) due to surgical blood loss or risk of bleeding: not applicable

## 2020-04-04 NOTE — Anesthesia Postprocedure Evaluation (Signed)
Anesthesia Post Note  Patient: Lindsay Horn  Procedure(s) Performed: CESAREAN SECTION (N/A )     Patient location during evaluation: Mother Baby Anesthesia Type: Epidural Level of consciousness: oriented and awake and alert Pain management: pain level controlled Vital Signs Assessment: post-procedure vital signs reviewed and stable Respiratory status: spontaneous breathing and respiratory function stable Cardiovascular status: blood pressure returned to baseline and stable Postop Assessment: no headache, no backache, no apparent nausea or vomiting and able to ambulate Anesthetic complications: no   No complications documented.  Last Vitals:  Vitals:   04/04/20 0540 04/04/20 0545  BP:  100/66  Pulse: 93 91  Resp: 13 12  Temp:    SpO2: 98% 97%    Last Pain:  Vitals:   04/04/20 0545  TempSrc:   PainSc: 0-No pain   Pain Goal:                Epidural/Spinal Function Cutaneous sensation: Able to Wiggle Toes (04/04/20 0545), Patient able to flex knees: Yes (04/04/20 0545), Patient able to lift hips off bed: No (04/04/20 0545), Back pain beyond tenderness at insertion site: No (04/04/20 0545), Progressively worsening motor and/or sensory loss: No (04/04/20 0545), Bowel and/or bladder incontinence post epidural: No (04/04/20 0545)  Barnet Glasgow

## 2020-04-04 NOTE — Anesthesia Procedure Notes (Signed)
Epidural Patient location during procedure: OB Start time: 04/04/2020 12:14 AM End time: 04/04/2020 12:30 AM  Staffing Anesthesiologist: Barnet Glasgow, MD Performed: anesthesiologist   Preanesthetic Checklist Completed: patient identified, IV checked, site marked, risks and benefits discussed, surgical consent, monitors and equipment checked, pre-op evaluation and timeout performed  Epidural Patient position: sitting Prep: DuraPrep and site prepped and draped Patient monitoring: continuous pulse ox and blood pressure Approach: midline Location: L3-L4 Injection technique: LOR air  Needle:  Needle type: Tuohy  Needle gauge: 17 G Needle length: 9 cm and 9 Needle insertion depth: 8 cm Catheter type: closed end flexible Catheter size: 19 Gauge Catheter at skin depth: 15 cm Test dose: negative  Assessment Events: blood not aspirated, injection not painful, no injection resistance, no paresthesia and negative IV test  Additional Notes Patient identified. Risks/Benefits/Options discussed with patient including but not limited to bleeding, infection, nerve damage, paralysis, failed block, incomplete pain control, headache, blood pressure changes, nausea, vomiting, reactions to medication both or allergic, itching and postpartum back pain. Confirmed with bedside nurse the patient's most recent platelet count. Confirmed with patient that they are not currently taking any anticoagulation, have any bleeding history or any family history of bleeding disorders. Patient expressed understanding and wished to proceed. All questions were answered. Sterile technique was used throughout the entire procedure. Please see nursing notes for vital signs. Test dose was given through epidural needle and negative prior to continuing to dose epidural or start infusion. Warning signs of high block given to the patient including shortness of breath, tingling/numbness in hands, complete motor block, or any  concerning symptoms with instructions to call for help. Patient was given instructions on fall risk and not to get out of bed. All questions and concerns addressed with instructions to call with any issues. 1 Attempt (S) . Patient tolerated procedure well.

## 2020-04-04 NOTE — Anesthesia Preprocedure Evaluation (Signed)
Anesthesia Evaluation  Patient identified by MRN, date of birth, ID band Patient awake    Reviewed: Allergy & Precautions, NPO status , Patient's Chart, lab work & pertinent test results  Airway Mallampati: III  TM Distance: >3 FB Neck ROM: Full    Dental no notable dental hx. (+) Teeth Intact   Pulmonary neg pulmonary ROS,    Pulmonary exam normal breath sounds clear to auscultation       Cardiovascular Exercise Tolerance: Good negative cardio ROS Normal cardiovascular exam Rhythm:Regular Rate:Normal     Neuro/Psych negative neurological ROS  negative psych ROS   GI/Hepatic negative GI ROS, Neg liver ROS,   Endo/Other  negative endocrine ROS  Renal/GU negative Renal ROS     Musculoskeletal negative musculoskeletal ROS (+)   Abdominal (+) + obese,   Peds  Hematology Hgb 11.4 Plt 173   Anesthesia Other Findings   Reproductive/Obstetrics (+) Pregnancy                             Anesthesia Physical Anesthesia Plan  ASA: III  Anesthesia Plan: Epidural   Post-op Pain Management:    Induction:   PONV Risk Score and Plan:   Airway Management Planned:   Additional Equipment:   Intra-op Plan:   Post-operative Plan:   Informed Consent: I have reviewed the patients History and Physical, chart, labs and discussed the procedure including the risks, benefits and alternatives for the proposed anesthesia with the patient or authorized representative who has indicated his/her understanding and acceptance.       Plan Discussed with:   Anesthesia Plan Comments: (39 wk G4P1 for TOLAC w LEA)        Anesthesia Quick Evaluation

## 2020-04-04 NOTE — Lactation Note (Addendum)
This note was copied from a baby's chart. Lactation Consultation Note  Patient Name: Lindsay Horn [id number] Date: 04/04/2020 Reason for consult: Initial assessment;Early term 24-38.6wks 18 10hrs old, mom sitting in bed holding baby, states baby just finished nursing ~31min right breast, dad sitting on couch. Mom reports nursing first child x11mo (child now 32yo), states baby had wt loss and difficulty transferring milk ~day 10 of life, resolved ~3weeks. Mom without current breastfeeding concerns. Mom easily expresses drops of colostrum prior to latching baby to left breast football hold, breast tissue movement and multiple audible swallows noted. Discussed cue based feedings, 8-12 in 24hrs, wake if >3hrs since last feeding, hand express after each feeding and offer back to baby, skin to skin, expected length of feeding, signs of adequate milk transfer, cluster feeding. Encouraged to call if with lactation concerns, otherwise will f/u tomorrow. Mom voiced understanding and with no further concerns. Left the room with baby still latched to left breast ~34min mark. BGilliam, RN, IBCLC  Addendum: Discussed supplementation options, if needed mom desires EBM first then breast milk of sister. BGilliam, RN, IBCLC  Maternal Data Formula Feeding for Exclusion: No Has patient been taught Hand Expression?: No Does the patient have breastfeeding experience prior to this delivery?: Yes  Feeding Feeding Type: Breast Fed  LATCH Score Latch: Grasps breast easily, tongue down, lips flanged, rhythmical sucking.  Audible Swallowing: Spontaneous and intermittent  Type of Nipple: Everted at rest and after stimulation  Comfort (Breast/Nipple): Soft / non-tender  Hold (Positioning): Assistance needed to correctly position infant at breast and maintain latch.  LATCH Score: 9  Interventions Interventions: Breast feeding basics reviewed;Assisted with latch;Skin to skin;Hand express;Breast  compression;Support pillows;Expressed milk  Lactation Tools Discussed/Used WIC Program: No   Consult Status Consult Status: Follow-up Date: 04/05/20 Follow-up type: In-patient    Bernita Buffy 04/04/2020, 2:07 PM

## 2020-04-04 NOTE — Transfer of Care (Signed)
Immediate Anesthesia Transfer of Care Note  Patient: Lindsay Horn  Procedure(s) Performed: CESAREAN SECTION (N/A )  Patient Location: PACU  Anesthesia Type:Epidural  Level of Consciousness: awake, alert  and oriented  Airway & Oxygen Therapy: Patient Spontanous Breathing  Post-op Assessment: Report given to RN  Post vital signs: Reviewed and stable  Last Vitals:  Vitals Value Taken Time  BP 118/69 04/04/20 0452  Temp    Pulse 102 04/04/20 0458  Resp 21 04/04/20 0458  SpO2 98 % 04/04/20 0458  Vitals shown include unvalidated device data.  Last Pain:  Vitals:   04/04/20 0100  TempSrc: Oral  PainSc:          Complications: No complications documented.

## 2020-04-04 NOTE — Progress Notes (Signed)
Patient ID: Lindsay Horn, female   DOB: Nov 21, 1985, 34 y.o.   MRN: [id number]  Pt very uncomfortable with contractions - has pressure in urethra.  Otherwise comfortable w epidural.    AFVSS gen tearful FHTs 140's, min/mod variability, early/variable decels, category 1-2 toco q 2-31min  SVE 4cm by nurse  D/w pt latent labor.  D/W pt risks, benefits and complications of repeat cesarean section - wishes to proceed.  Will proceed when OR ready.

## 2020-04-04 NOTE — Progress Notes (Signed)
Attempted to ambulate patient to restroom, patient became dizzy and lightheaded, was only able to stand at bedside for a few minutes.  Next BP reading was 92/49 with a recheck of 80/49.  Urine output continued to be decreased and concentrated.  Pt had two episodes of vomiting earlier this morning, able to tolerate some fluids.  Informed Dr. Melba Coon, ordered 536ml LR bolus at 961ml/hr and to do remaining 565ml at 146ml/hr with a stat CBC.  Will continue to monitor.

## 2020-04-04 NOTE — Op Note (Signed)
NAMESCHERRIE, SENECA MEDICAL RECORD OX:[id number] ACCOUNT [id number] DATE OF BIRTH:13-Sep-1985 FACILITY: [facility] LOCATION: [facility] PHYSICIAN:Cheresa Siers BOVARD-STUCKERT, MD  OPERATIVE REPORT  DATE OF PROCEDURE:  04/04/2020  PREOPERATIVE DIAGNOSES:  Intrauterine pregnancy at term, failed trial of labor after cesarean section, elective repeat, urethral pain and pressure as well as arrest of dilatation.  POSTOPERATIVE DIAGNOSES:  Intrauterine pregnancy at term, failed trial of labor after cesarean section, elective repeat, urethral pain and pressure as well as arrest of dilatation, delivered.  PROCEDURE:  Low transverse cesarean section.  SURGEON:  Thornell Sartorius, MD   ANESTHESIA:  Epidural.  ESTIMATED BLOOD LOSS:  440 mL.  INTRAVENOUS FLUID AND URINE OUTPUT:  Per anesthesia.  Clear concentrated urine at the end of the procedure.  COMPLICATIONS:  None.  PATHOLOGY:  Placenta to labor and delivery.  FINDINGS:  Viable female infant at 4:04 a.m. with Apgars of 9 at one minute and 9 at five minutes and a normal uterus, tubes and ovaries.  DESCRIPTION OF PROCEDURE:  After informed consent was reviewed with the patient including risks, benefits and alternatives of the surgical procedure including but not limited to bleeding, infection, damage to surrounding organs, difficulty healing,  injury to infant.  She was transported to the operating room after her epidural was dosed.  She was placed on table in supine position with a leftward tilt.  Once her epidural reached an appropriate level she was prepped and draped in the normal sterile  fashion.  A Foley catheter had sterilely been placed.  An appropriate timeout was performed.  Pfannenstiel skin incision was made at the level of her previous incision and carried through to the underlying layer of fascia sharply.  The fascia was incised  in the midline.  The midline and incision was extended laterally with Mayo scissors.  Superior aspect of the fascial  incision was grasped with Kocher clamps, elevated and the rectus muscles were dissected off both bluntly and sharply.  Midline was  easily identified and the peritoneum was entered bluntly.  This incision was extended superiorly and inferiorly with good visualization of the bladder.  Alexis retractor was carefully placed, making sure that no bowel was entrapped.  The uterus was  explored and bladder flap was created both digitally and sharply.  The uterus was incised in a transverse fashion.  Infant was delivered with aid of a vacuum.  Nose and mouth were suctioned on the field.  Infant was dried.  After a minute, the cord was  clamped and cut and the infant was handed off to the waiting pediatric staff.  The uterus was cleared of all clot and debris.  The incision was reapproximated with 2 layers of 0 Monocryl, the 1st of which was running locked and the 2nd as an imbricating  layer.  Several sutures were placed for hemostasis at the incision.  The gutters were cleared of all clot and debris and normal anatomy was confirmed.  The Alexis retractor was removed.  The peritoneum was reapproximated with 2-0 Vicryl in a running  fashion.  Subfascial planes were inspected and found to be hemostatic.  The fascia was then closed in a single suture of 0 Vicryl in a running fashion.  The subcuticular adipose layer was made hemostatic with Bovie cautery and the dead space was closed  with plain gut.  The skin was reapproximated with 4-0 Vicryl on a Keith needle in a subcuticular fashion.  Benzoin and Steri-Strips were applied.  The patient tolerated the procedure well.  Sponge, lap, and  needle count was correct x 2 per operating  staff.  CN/NUANCE  D:04/04/2020 T:04/04/2020 JOB:[id number]

## 2020-04-05 ENCOUNTER — Encounter (HOSPITAL_COMMUNITY): Payer: Self-pay | Admitting: Obstetrics and Gynecology

## 2020-04-05 LAB — CBC
HCT: 27.3 % — ABNORMAL LOW (ref 36.0–46.0)
Hemoglobin: 8.5 g/dL — ABNORMAL LOW (ref 12.0–15.0)
MCH: 28.9 pg (ref 26.0–34.0)
MCHC: 31.1 g/dL (ref 30.0–36.0)
MCV: 92.9 fL (ref 80.0–100.0)
Platelets: 134 10*3/uL — ABNORMAL LOW (ref 150–400)
RBC: 2.94 MIL/uL — ABNORMAL LOW (ref 3.87–5.11)
RDW: 13.9 % (ref 11.5–15.5)
WBC: 10.3 10*3/uL (ref 4.0–10.5)
nRBC: 0 % (ref 0.0–0.2)

## 2020-04-05 MED ORDER — IBUPROFEN 600 MG PO TABS
600.0000 mg | ORAL_TABLET | Freq: Four times a day (QID) | ORAL | Status: DC | PRN
  Administered 2020-04-05 – 2020-04-06 (×5): 600 mg via ORAL
  Filled 2020-04-05 (×5): qty 1

## 2020-04-05 MED ORDER — INFLUENZA VAC SPLIT QUAD 0.5 ML IM SUSY
0.5000 mL | PREFILLED_SYRINGE | INTRAMUSCULAR | Status: DC
  Filled 2020-04-05: qty 0.5

## 2020-04-05 MED ORDER — ACETAMINOPHEN 325 MG PO TABS
650.0000 mg | ORAL_TABLET | Freq: Four times a day (QID) | ORAL | Status: DC | PRN
  Administered 2020-04-05 – 2020-04-06 (×5): 650 mg via ORAL
  Filled 2020-04-05 (×5): qty 2

## 2020-04-05 NOTE — Progress Notes (Addendum)
Post Partum Day 1 Subjective: no complaints, up ad lib, voiding, tolerating PO, + flatus and lochia mild. Pt would like to take ibuprofen and tylenol together - feels will control pain better. Bonding well with baby. Denies HA, blurry vision, CP. Has LE edema.   Objective: Blood pressure (!) 89/46, pulse 83, temperature 98.3 F (36.8 C), temperature source Oral, resp. rate 18, height 5\' 4"  (1.626 m), weight 132.5 kg, last menstrual period 07/07/2019, SpO2 96 %, unknown if currently breastfeeding.  Physical Exam:  General: alert, cooperative and no distress Lochia: appropriate Uterine Fundus: firm Incision: healing well; mild drainage DVT Evaluation: No evidence of DVT seen on physical exam. Calf/Ankle edema is present.  Recent Labs    04/04/20 1711 04/05/20 0436  HGB 9.9* 8.5*  HCT 31.4* 27.3*    Assessment/Plan: Breastfeeding  Routine pp care   LOS: 2 days   Lindsay Horn W Luevenia Mcavoy 04/05/2020, 10:25 AM

## 2020-04-05 NOTE — Lactation Note (Signed)
This note was copied from a baby's chart. Lactation Consultation Note  Patient Name: Lindsay Horn [id number] Date: 04/05/2020   Attempted to visit with mom again but she's still sound asleep (snoring). Asked dad how BF is going and he said it's going well so far. Asked dad to call for lactation if mom needs any assistance with latching has questions or any concerns.   Maternal Data    Feeding Feeding Type: Breast Fed  LATCH Score                   Interventions    Lactation Tools Discussed/Used     Consult Status      Lindsay Horn 04/05/2020, 4:37 PM

## 2020-04-05 NOTE — Progress Notes (Signed)
Pt output 725 ml @0330  and additional output of 350 ml @ 0445. Urine transitioned from tea colored to yellow and clear. Due to this change, foley catheter was removed at 0445. LR at 125 ml/hr. Pt informed to void within 6 hours and to continue to drink water. Pt ambulated well to bathroom with no complaints of dizziness or nausea and vomiting. Will continue to monitor.

## 2020-04-05 NOTE — Lactation Note (Signed)
This note was copied from a baby's chart. Lactation Consultation Note  Patient Name: Lindsay Horn HYHOO'I Date: 04/05/2020    Attempted to visit with mom but she was asleep. Dad asked LC to come back later this afternoon. Will return for F/U lactation asst.   Maternal Data    Feeding Feeding Type: Breast Fed  LATCH Score                   Interventions    Lactation Tools Discussed/Used     Consult Status      Lindsay Horn 04/05/2020, 1:40 PM

## 2020-04-06 MED ORDER — IBUPROFEN 800 MG PO TABS
800.0000 mg | ORAL_TABLET | Freq: Three times a day (TID) | ORAL | 1 refills | Status: DC | PRN
Start: 2020-04-06 — End: 2022-06-01

## 2020-04-06 MED ORDER — OXYCODONE-ACETAMINOPHEN 5-325 MG PO TABS
1.0000 | ORAL_TABLET | ORAL | 0 refills | Status: AC | PRN
Start: 2020-04-06 — End: 2020-04-13

## 2020-04-06 NOTE — Lactation Note (Signed)
This note was copied from a baby's chart. Lactation Consultation Note  Patient Name: Lindsay Horn KCLEX'N Date: 04/06/2020 Reason for consult: Follow-up assessment;Early term 85-38.6wks  P2 mother whose infant is now 51 hours old.  This is an ETI at 38+6 weeks.  Mother breast fed her first child (now 32 years old) for 14 months.  Baby was asleep when I arrived.  Mother was interested in having me observe a latch and wanted to awaken baby.  I offered to assist. Mother was able to easily express colostrum drops.  Positioned mother appropriately and observed mother latching baby to the right breast in the football hold.  Baby's latch was shallow.  Reviewed how to obtain a deep latch and assisted mother to support her breast and latch baby deeply into breast tissue.  Mother noticed the difference with a deeper latch. Observed baby feeding for 5 minutes before she pulled off.  She lay content in mother's lap.    Mother will continue to feed 8-12 times/24 hours or sooner if baby shows feeding cues.  She will hand express and feed back any EBM she obtains to baby.  Engorgement prevention/treatment reviewed.  Provided a manual pump with instructions for use.  Mother has a DEBP for home use.  She feels comfortable with breast feeding and is ready to be discharged today.  Pediatrician in room at the end of my visit and baby will return for a first pediatric follow up on Monday.    Father present.   Mother will call for any further questions/concerns prior to discharge.   Maternal Data Does the patient have breastfeeding experience prior to this delivery?: Yes  Feeding Feeding Type: Breast Fed  LATCH Score Latch: Repeated attempts needed to sustain latch, nipple held in mouth throughout feeding, stimulation needed to elicit sucking reflex.  Audible Swallowing: A few with stimulation  Type of Nipple: Everted at rest and after stimulation  Comfort (Breast/Nipple): Soft / non-tender  Hold  (Positioning): No assistance needed to correctly position infant at breast.  LATCH Score: 8  Interventions Interventions: Breast feeding basics reviewed;Assisted with latch;Skin to skin;Breast compression;Hand pump;Support pillows  Lactation Tools Discussed/Used     Consult Status Consult Status: Complete Date: 04/06/20 Follow-up type: Call as needed    Lanice Schwab Yadiel Aubry 04/06/2020, 9:34 AM

## 2020-04-06 NOTE — Discharge Instructions (Signed)
Call office with any concerns [phone] 

## 2020-04-06 NOTE — Progress Notes (Signed)
Subjective: Postpartum Day 2: Cesarean Delivery Patient reports tolerating PO, + flatus, + BM and no problems voiding.  Lochia mild. No HA, blurry vision, CP or SOB. She reports pain better controlled. She is bonding well with baby. Desires discharge to home today  Objective: Vital signs in last 24 hours: Temp:  [97.5 F (36.4 C)-98.2 F (36.8 C)] 97.5 F (36.4 C) (09/18 0541) Pulse Rate:  [86-89] 86 (09/18 0541) Resp:  [18-20] 18 (09/18 0541) BP: (95-115)/(56-68) 115/68 (09/18 0541) SpO2:  [99 %-100 %] 100 % (09/18 0541)  Physical Exam:  General: alert, cooperative and no distress Lochia: appropriate Uterine Fundus: firm Incision: no significant drainage DVT Evaluation: No evidence of DVT seen on physical exam. Calf/Ankle edema is present - significant  Recent Labs    04/04/20 1711 04/05/20 0436  HGB 9.9* 8.5*  HCT 31.4* 27.3*    Assessment/Plan: Status post Cesarean section. Doing well postoperatively.  Discharge home with standard precautions and return to clinic in 2 weeks for incision check and 6 weeks for postpartum visit  Lindsay Horn W Lindsay Horn 04/06/2020, 12:10 PM

## 2020-04-06 NOTE — Discharge Summary (Signed)
Postpartum Discharge Summary  Date of Service updated      Patient Name: Lindsay Horn DOB: 1985-07-31 MRN: [id number]  Date of admission: 04/03/2020 Delivery date:04/04/2020  Delivering provider: Janyth Contes  Date of discharge: 04/06/2020  Admitting diagnosis: Normal labor [O80, Z37.9] Intrauterine pregnancy: [redacted]w[redacted]d    Secondary diagnosis:  Principal Problem:   Status post repeat low transverse cesarean section Active Problems:   Normal labor   Delayed delivery after spontaneous rupture of membranes  Additional problems: none    Discharge diagnosis: Term Pregnancy Delivered                                              Post partum procedures:n/a Augmentation: Pitocin Complications: RWLS>93hours  Hospital course: Onset of Labor With Unplanned C/S   34y.o. yo [id number] admitted in Latent Labor on 04/03/2020. Patient had a labor course significant for prolonged labor. The patient went for cesarean section due to Arrest of Descent. Delivery details as follows: Membrane Rupture Time/Date: 3:20 AM ,04/03/2020   Delivery Method:C-Section, Vacuum Assisted  Details of operation can be found in separate operative note. Patient had an uncomplicated postpartum course.  She is ambulating,tolerating a regular diet, passing flatus, and urinating well.  Patient is discharged home in stable condition 04/06/20.  Newborn Data: Birth date:04/04/2020  Birth time:4:04 AM  Gender:Female  Living status:Living  Apgars:9 ,9  Weight:3150 g   Magnesium Sulfate received: No BMZ received: No Rhophylac:N/A MMR:N/A T-DaP:Given prenatally Flu: N/A Transfusion:No  Physical exam  Vitals:   04/05/20 0331 04/05/20 1627 04/05/20 2130 04/06/20 0541  BP: (!) 89/46 112/62 (!) 95/56 115/68  Pulse: 83 89 89 86  Resp: _0 Temp: 98.3 F (36.8 C) 98.1 F (36.7 C) 98.2 F (36.8 C) (!) 97.5 F (36.4 C)  TempSrc: Oral Oral Oral Oral  SpO2: 96% 99% 99% 100%  Weight:       Height:       General: alert, cooperative and no distress Lochia: appropriate Uterine Fundus: firm Incision: No significant erythema, Dressing is clean, dry, and intact DVT Evaluation: No evidence of DVT seen on physical exam. Calf/Ankle edema is present Labs: Lab Results  Component Value Date   WBC 10.3 04/05/2020   HGB 8.5 (L) 04/05/2020   HCT 27.3 (L) 04/05/2020   MCV 92.9 04/05/2020   PLT 134 (L) 04/05/2020   CMP Latest Ref Rng & Units 04/04/2020  BUN 6 - 20 mg/dL -  Creatinine 0.44 - 1.00 mg/dL 0.71  AST 15 - 41 U/L -   Edinburgh Score: Edinburgh Postnatal Depression Scale Screening Tool 04/04/2020  I have been able to laugh and see the funny side of things. 0  I have looked forward with enjoyment to things. 0  I have blamed myself unnecessarily when things went wrong. 2  I have been anxious or worried for no good reason. 2  I have felt scared or panicky for no good reason. 0  Things have been getting on top of me. 0  I have been so unhappy that I have had difficulty sleeping. 0  I have felt sad or miserable. 0  I have been so unhappy that I have been crying. 0  The thought of harming myself has occurred to me. 0  Edinburgh Postnatal Depression Scale Total 4  After visit meds:  Allergies as of 04/06/2020      Reactions   Eggs Or Egg-derived Products Nausea And Vomiting   GI cramping  vomiting   Codeine Hives   Pt reports taking percocet in the past with no problems      Medication List    TAKE these medications   acetaminophen 325 MG tablet Commonly known as: Tylenol Take 2 tablets (650 mg total) by mouth every 4 (four) hours as needed (for pain scale < 4).   doxylamine (Sleep) 25 MG tablet Commonly known as: UNISOM Take 25 mg by mouth at bedtime as needed.   ibuprofen 800 MG tablet Commonly known as: ADVIL Take 1 tablet (800 mg total) by mouth every 8 (eight) hours as needed for moderate pain or cramping. What changed:   when to take  this  reasons to take this   loratadine 10 MG tablet Commonly known as: CLARITIN Take 1 tablet by mouth daily.   oxyCODONE-acetaminophen 5-325 MG tablet Commonly known as: Percocet Take 1 tablet by mouth every 4 (four) hours as needed for up to 7 days for severe pain.   prenatal multivitamin Tabs tablet Take 1 tablet by mouth daily at 12 noon.   pyridoxine 100 MG tablet Commonly known as: B-6 Take 100 mg by mouth daily.        Discharge home in stable condition Infant Feeding: Breast Infant Disposition:home with mother Discharge instruction: per After Visit Summary and Postpartum booklet. Activity: Advance as tolerated. Pelvic rest for 6 weeks.  Diet: routine diet Anticipated Birth Control: Unsure Postpartum Appointment:6 weeks Additional Postpartum F/U: Incision check 2 weeks Future Appointments:No future appointments. Follow up Visit:  Follow-up Information    Bovard-Stuckert, Jody, MD. Schedule an appointment as soon as possible for a visit.   Specialty: Obstetrics and Gynecology Why: 2 weeks for incision check and 6weeks for postpartum visit Contact information: [place] [address] [phone]                   04/06/2020 Isaiah Serge, DO

## 2020-04-12 ENCOUNTER — Inpatient Hospital Stay (HOSPITAL_COMMUNITY): Admit: 2020-04-12 | Payer: Self-pay

## 2020-04-15 ENCOUNTER — Inpatient Hospital Stay (HOSPITAL_COMMUNITY)
Admission: RE | Admit: 2020-04-15 | Payer: BC Managed Care – PPO | Source: Home / Self Care | Admitting: Obstetrics and Gynecology

## 2020-07-11 ENCOUNTER — Telehealth: Payer: Self-pay | Admitting: Genetic Counselor

## 2020-07-11 NOTE — Telephone Encounter (Signed)
Received a genetic counseling referral from Dr. Melba Coon for fhx of breast cancer. Lindsay Horn has been cld and scheduled to see Cari on 1/17 at 11am. Pt aware to arrive 15 minutes early.

## 2020-08-05 ENCOUNTER — Other Ambulatory Visit: Payer: BC Managed Care – PPO

## 2020-08-05 ENCOUNTER — Encounter: Payer: BC Managed Care – PPO | Admitting: Genetic Counselor

## 2021-07-17 ENCOUNTER — Telehealth: Payer: Self-pay | Admitting: Genetic Counselor

## 2021-07-17 NOTE — Telephone Encounter (Signed)
Scheduled appt per 12/28 referral. Pt is aware of appt date and time. Pt is aware to arrive 15 mins prior to appt.

## 2021-08-14 ENCOUNTER — Inpatient Hospital Stay: Payer: BC Managed Care – PPO | Attending: Genetic Counselor | Admitting: Genetic Counselor

## 2021-08-14 ENCOUNTER — Other Ambulatory Visit: Payer: Self-pay

## 2021-08-14 ENCOUNTER — Inpatient Hospital Stay: Payer: BC Managed Care – PPO

## 2021-08-14 DIAGNOSIS — Z800 Family history of malignant neoplasm of digestive organs: Secondary | ICD-10-CM | POA: Diagnosis not present

## 2021-08-14 DIAGNOSIS — Z803 Family history of malignant neoplasm of breast: Secondary | ICD-10-CM

## 2021-08-19 ENCOUNTER — Encounter: Payer: Self-pay | Admitting: Genetic Counselor

## 2021-08-19 DIAGNOSIS — Z803 Family history of malignant neoplasm of breast: Secondary | ICD-10-CM

## 2021-08-19 DIAGNOSIS — Z800 Family history of malignant neoplasm of digestive organs: Secondary | ICD-10-CM

## 2021-08-19 HISTORY — DX: Family history of malignant neoplasm of breast: Z80.3

## 2021-08-19 HISTORY — DX: Family history of malignant neoplasm of digestive organs: Z80.0

## 2021-08-19 NOTE — Progress Notes (Signed)
REFERRING PROVIDER: Janyth Contes, MD [address] [place],  [place] [address]  PRIMARY PROVIDER:  Janyth Contes, MD  PRIMARY REASON FOR VISIT:  1. Family history of breast cancer   2. Family history of pancreatic cancer    HISTORY OF PRESENT ILLNESS:   Ms. Dunnavant, a 36 y.o. female, was seen for a [facility] cancer genetics consultation at the request of Dr. Sandford Craze due to a family history of cancer.  Ms. Hunsberger presents to clinic today to discuss the possibility of a hereditary predisposition to cancer, to discuss genetic testing, and to further clarify her future cancer risks, as well as potential cancer risks for family members.  Ms. Riga is a 36 y.o. female with no personal history of cancer.    CANCER HISTORY:  Oncology History   No history exists.    RISK FACTORS:  Menarche was at age 55.  First live birth at age 58.  OCP use for less than 2 years.  Ovaries intact: yes.  Hysterectomy: no.  Menopausal status: premenopausal.  HRT use: 0 years. Colonoscopy: no; not examined. Mammogram within the last year: no. Number of breast biopsies: 0. Up to date with pelvic exams: yes; most recent PAP in 2021 Any excessive radiation exposure in the past: no Dermatology as needed.   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   Delayed delivery after spontaneous rupture of membranes 04/03/2020   Family history of breast cancer 08/19/2021   Family history of pancreatic cancer 08/19/2021   Fetal demise, less than 22 weeks 12/11/2014   Medical history non-contributory    S/P cesarean section 11/28/2016   Status post repeat low transverse cesarean section 04/04/2020    Past Surgical History:  Procedure Laterality Date   APPENDECTOMY     APPENDECTOMY  2003   CESAREAN SECTION N/A 11/28/2016   Procedure: CESAREAN SECTION;  Surgeon: Janyth Contes, MD;  Location: [facility];  Service:  Obstetrics;  Laterality: N/A;   CESAREAN SECTION N/A 04/04/2020   Procedure: CESAREAN SECTION;  Surgeon: Janyth Contes, MD;  [facility];  Service: Obstetrics;  Laterality: N/A;  Tracey RNFA   DILATION AND EVACUATION N/A 12/12/2014   Procedure: DILATATION AND EVACUATION (D&E) 2ND TRIMESTER with ultrasound guidance;  Surgeon: Janyth Contes, MD;  Location: [facility];  Service: Gynecology;  Laterality: N/A;   HYSTEROSCOPY     removal of uterine septum   WISDOM TOOTH EXTRACTION  2003      FAMILY HISTORY:  We obtained a detailed, 4-generation family history.  Significant diagnoses are listed below: Family History  Problem Relation Age of Onset   Breast cancer Mother        GT negative in 2015   Skin cancer Sister    Skin cancer Brother        dx 20s   Hodgkin's lymphoma Paternal Aunt        dx 88s   Throat cancer Paternal Aunt 30   Breast cancer Maternal Grandmother 72   Skin cancer Maternal Grandfather        SCC; late 38s   Colon cancer Maternal Grandfather        dx 61s   Pancreatic cancer Paternal Grandfather 42   Hodgkin's lymphoma Paternal Grandfather 25   Breast cancer Other        MGM's mother d. 39s-90s   Ovarian cancer Other        MGM's mother d. 70s-90s  Melanoma Other        MGM's father    Ms. Wojnarowski's  mother had negative genetic testing in 2015 through [facility] which included the following genes-- APC, ATM, AXIN2, BARD1, BMPR1A, BRCA1, BRCA2, BRIP1, CDH1, CDK4, CDKN2A, CHEK2,  EPCAM, FANCC, MLH1, MSH2, MSH6, MUTYH, NBN, PALB2, PMS2, PTEN, RAD51C, RAD51D, SMAD4, STK11, TP53, VHL, XRCC2.  Her maternal grandmother reportedly had negative genetic testing. Ms. Bischof is unaware of other previous family history of genetic testing for hereditary cancer risks. There is no reported Ashkenazi Jewish ancestry. There is no known consanguinity.  GENETIC COUNSELING ASSESSMENT: Ms. Balik is a 36 y.o. female with a paternal family history of cancer which is  somewhat suggestive of a hereditary cancer syndrome and predisposition to cancer given the presence of pancreatic cancer in paternal grandfather. We, therefore, discussed and recommended the following at today's visit.   DISCUSSION: We discussed that 5 - 10% of cancer is hereditary, with most cases of hereditary pancreatic cancer associated with mutations in BRCA1/2.  There are other genes that can be associated with hereditary pancreatic cancer syndromes.  Type of cancer risk and level of risk are gene-specific.  We discussed that testing is beneficial for several reasons, including knowing about other cancer risks, identifying potential screening and risk-reduction options that may be appropriate, and to understanding if other family members could be at risk for cancer and allowing them to undergo genetic testing.  We discussed that given her mother's negative genetic testing results, her mother could not have passed on an identifiable hereditary cancer mutation in the genes tested.  We reviewed that her father's family history of pancreatic cancer qualifies her for genetic testing. We reviewed the characteristics, features and inheritance patterns of hereditary cancer syndromes. We also discussed genetic testing, including the appropriate family members to test, the process of testing, insurance coverage and turn-around-time for results. We discussed the implications of a negative, positive, carrier and/or variant of uncertain significant result. We discussed that negative results would be uninformative given that Ms. Pellicano does not have a personal history of cancer. We recommended Ms. Colt pursue genetic testing for a panel that contains genes associated with pancreatic cancer.  Based on Ms. Brandenberger's paternal family history of cancer, she meets medical criteria for genetic testing. Despite that she meets criteria, she may still have an out of pocket cost.   We discussed that some people do not want to  undergo genetic testing due to fear of genetic discrimination.  A federal law called the Genetic Information Non-Discrimination Act (GINA) of 2008 helps protect individuals against genetic discrimination based on their genetic test results.  It impacts both health insurance and employment.  With health insurance, it protects against increased premiums, being kicked off insurance or being forced to take a test in order to be insured.  For employment it protects against hiring, firing and promoting decisions based on genetic test results.  GINA does not apply to those in the [employer], those who work for companies with less than 15 employees, and new life insurance or long-term disability insurance policies.  Health status due to a cancer diagnosis is not protected under GINA.   Ms. Rodenbaugh has been determined to be at high risk for breast cancer.  Her lifetime risk for breast cancer based on Tyrer-Cuzick risk model is approximately 24%.  This risk estimate can change over time and may be repeated to reflect new information in her personal or family history in the future.  For females  with a greater than 20% lifetime risk of breast cancer, the [employer]  (NCCN) recommends the following:  Clinical encounter every 6-12 months to begin when identified as being at increased risk, but not before age 72  Annual mammograms. Tomosynthesis is recommended starting 10 years earlier than the youngest breast cancer diagnosis in the family or at age 53 (whichever comes first), but not before age 79   Annual breast MRI starting 10 years earlier than the youngest breast cancer diagnosis in the family or at age 25 (whichever comes first), but not before age 41    Ms. Mcjunkins should discuss an appropriate age at which she should beginning mammograms and the usefulness of an annual breast MRI with her referring provider and/or the high-risk clinic providers.  Ms. Lindo is not interested in a referral to  the high risk breast clinic at this time.  She knows to reach out in the future if she is interested.     PLAN: Ms. Feagan did not wish to pursue genetic testing at today's visit. She wishes to wait to see if additional family history of cancer arises in her paternal family before considering testing.   We, therefore, recommend Ms. Piazza speak with her primary care provider about her high risk breast cancer status continue to follow the cancer screening guidelines given by her primary healthcare provider.  Lastly, we encouraged Ms. Studer to remain in contact with cancer genetics annually so that we can continuously update the family history and inform her of any changes in cancer genetics and testing that may be of benefit for this family.   Ms. Patin questions were answered to her satisfaction today. Our contact information was provided should additional questions or concerns arise. Thank you for the referral and allowing [place] to share in the care of your patient.   Johnwesley Lederman M. Joette Catching, [place], [facility] Genetic Counselor [email or web address] [phone]) [phone]   The patient was seen for a total of 40 minutes in face-to-face genetic counseling.  The patient was seen alone.  Drs.Lindi Adie and/or Burr Medico were available to discuss this case as needed.  _______________________________________________________________________ For Office Staff:  Number of people involved in session: 1 Was an Intern/ student involved with case: no

## 2022-03-13 ENCOUNTER — Emergency Department (HOSPITAL_BASED_OUTPATIENT_CLINIC_OR_DEPARTMENT_OTHER): Payer: BC Managed Care – PPO | Admitting: Radiology

## 2022-03-13 ENCOUNTER — Encounter (HOSPITAL_BASED_OUTPATIENT_CLINIC_OR_DEPARTMENT_OTHER): Payer: Self-pay

## 2022-03-13 ENCOUNTER — Other Ambulatory Visit: Payer: Self-pay

## 2022-03-13 ENCOUNTER — Emergency Department (HOSPITAL_BASED_OUTPATIENT_CLINIC_OR_DEPARTMENT_OTHER)
Admission: EM | Admit: 2022-03-13 | Discharge: 2022-03-13 | Disposition: A | Discharge status: Home / Self Care | Payer: BC Managed Care – PPO | Attending: Emergency Medicine | Admitting: Emergency Medicine

## 2022-03-13 DIAGNOSIS — M65242 Calcific tendinitis, left hand: Secondary | ICD-10-CM | POA: Insufficient documentation

## 2022-03-13 DIAGNOSIS — M779 Enthesopathy, unspecified: Secondary | ICD-10-CM

## 2022-03-13 DIAGNOSIS — M79642 Pain in left hand: Secondary | ICD-10-CM | POA: Diagnosis present

## 2022-03-13 NOTE — ED Triage Notes (Addendum)
Patient here POV from Home.  Endorses Left Hand Pain that began 3-4 Days ago. Swelling Noted Today. Tender to Touch.  No Fevers. No Anticoagulants. No Known Injury. Pulse Palpable.  NAD noted during Triage. A&Ox4. GCS 15. Ambulatory.

## 2022-03-13 NOTE — Discharge Instructions (Addendum)
You were seen in the emergency department today for hand swelling. You may have tendonitis. We are placing you in a thumb spica splint to allow the tendon to heal. Please also take '600mg'$  ibuprofen every 8 hours over the next few days for inflammation. If you begin having swelling of your entire arm please present to the emergency department to ensure you do not have a blood clot. You the redness, swelling, pain become worse or begin streaking up your arm, return to the emergency department.

## 2022-03-13 NOTE — ED Notes (Signed)
Pt awake and alert - GCS 15.  RR even and unlabored on RA with symmetrical rise and fall of chest.  L thumb splint securely in place; LUE distal neurovascular status remains intact with nonpitting edema still visible.  Plan for pt to d/c and wear splint until tendon heals -- pt agreeable with plan -- this nurse has verbally reinforced d/c instructions and provided pt with written copy- pt acknowledges verbal understanding and denies any additional questions, concerns, needs- pt ambulatory independently at d/c with steady gait; vitals stable; no distress.

## 2022-03-13 NOTE — ED Provider Notes (Signed)
[facility] EMERGENCY DEPT Provider Note   CSN: [id number] Arrival date & time: 03/13/22  1841     History  Chief Complaint  Patient presents with   Hand Pain    Lindsay Horn is a 36 y.o. female. Swelling today Painful since Tuesday  Just lighter touch is hurt, pain not as much with movement.  No trauma to the hand  Did not notice any bug bites or open wounds No fevers  No numbness or tingling   Hand Pain       Home Medications Prior to Admission medications   Medication Sig Start Date End Date Taking? Authorizing Provider  acetaminophen (TYLENOL) 325 MG tablet Take 2 tablets (650 mg total) by mouth every 4 (four) hours as needed (for pain scale < 4). 11/30/16   Paula Compton, MD  doxylamine, Sleep, (UNISOM) 25 MG tablet Take 25 mg by mouth at bedtime as needed.    [provider]  ibuprofen (ADVIL) 800 MG tablet Take 1 tablet (800 mg total) by mouth every 8 (eight) hours as needed for moderate pain or cramping. 04/06/20   Banga, Bonnee Quin, DO  loratadine (CLARITIN) 10 MG tablet Take 1 tablet by mouth daily.    [provider]  Prenatal Vit-Fe Fumarate-FA (PRENATAL MULTIVITAMIN) TABS tablet Take 1 tablet by mouth daily at 12 noon.    [provider]  pyridoxine (B-6) 100 MG tablet Take 100 mg by mouth daily.    [provider]      Allergies    Eggs or egg-derived products and Codeine    Review of Systems   Review of Systems  Physical Exam Updated Vital Signs BP 128/75 (BP Location: Right Arm)   Pulse 70   Temp 98.2 F (36.8 C) (Oral)   Resp 18   Ht '5\' 4"'$  (1.626 m)   Wt 132.5 kg   SpO2 100%   BMI 50.14 kg/m  Physical Exam  ED Results / Procedures / Treatments   Labs (all labs ordered are listed, but only abnormal results are displayed) Labs Reviewed - No data to display  EKG None  Radiology DG Hand Complete Left  Result Date: 03/13/2022 CLINICAL DATA:  Left hand pain and swelling EXAM:  LEFT HAND - COMPLETE 3+ VIEW COMPARISON:  None Available. FINDINGS: There is no evidence of fracture or dislocation. There is no evidence of arthropathy or other focal bone abnormality. Soft tissue swelling is noted along the dorsum of the left hand and visualized left wrist. IMPRESSION: Soft tissue swelling. No fracture or dislocation. Electronically Signed   By: Fidela Salisbury M.D.   On: 03/13/2022 19:37    Procedures Procedures  {Document cardiac monitor, telemetry assessment procedure when appropriate:1}  Medications Ordered in ED Medications - No data to display  ED Course/ Medical Decision Making/ A&P                           Medical Decision Making Amount and/or Complexity of Data Reviewed Radiology: ordered.   ***  {Document critical care time when appropriate:1} {Document review of labs and clinical decision tools ie heart score, Chads2Vasc2 etc:1}  {Document your independent review of radiology images, and any outside records:1} {Document your discussion with family members, caretakers, and with consultants:1} {Document social determinants of health affecting pt's care:1} {Document your decision making why or why not admission, treatments were needed:1} Final Clinical Impression(s) / ED Diagnoses Final diagnoses:  None    Rx /  DC Orders ED Discharge Orders     None       

## 2022-06-01 ENCOUNTER — Encounter (HOSPITAL_COMMUNITY): Payer: Self-pay

## 2022-06-01 ENCOUNTER — Inpatient Hospital Stay (HOSPITAL_COMMUNITY)
Admission: AD | Admit: 2022-06-01 | Discharge: 2022-06-01 | Disposition: A | Discharge status: Home / Self Care | Payer: BC Managed Care – PPO | Attending: Obstetrics and Gynecology | Admitting: Obstetrics and Gynecology

## 2022-06-01 DIAGNOSIS — O209 Hemorrhage in early pregnancy, unspecified: Secondary | ICD-10-CM | POA: Insufficient documentation

## 2022-06-01 DIAGNOSIS — R42 Dizziness and giddiness: Secondary | ICD-10-CM | POA: Diagnosis not present

## 2022-06-01 DIAGNOSIS — R531 Weakness: Secondary | ICD-10-CM | POA: Diagnosis not present

## 2022-06-01 DIAGNOSIS — Z3A01 Less than 8 weeks gestation of pregnancy: Secondary | ICD-10-CM | POA: Insufficient documentation

## 2022-06-01 DIAGNOSIS — O26891 Other specified pregnancy related conditions, first trimester: Secondary | ICD-10-CM | POA: Insufficient documentation

## 2022-06-01 DIAGNOSIS — O219 Vomiting of pregnancy, unspecified: Secondary | ICD-10-CM | POA: Insufficient documentation

## 2022-06-01 DIAGNOSIS — O09291 Supervision of pregnancy with other poor reproductive or obstetric history, first trimester: Secondary | ICD-10-CM | POA: Diagnosis not present

## 2022-06-01 LAB — URINALYSIS, ROUTINE W REFLEX MICROSCOPIC
Glucose, UA: NEGATIVE mg/dL
Ketones, ur: 80 mg/dL — AB
Nitrite: NEGATIVE
Protein, ur: 30 mg/dL — AB
Specific Gravity, Urine: 1.031 — ABNORMAL HIGH (ref 1.005–1.030)
pH: 6 (ref 5.0–8.0)

## 2022-06-01 LAB — COMPREHENSIVE METABOLIC PANEL
ALT: 29 U/L (ref 0–44)
AST: 26 U/L (ref 15–41)
Albumin: 3.7 g/dL (ref 3.5–5.0)
Alkaline Phosphatase: 70 U/L (ref 38–126)
Anion gap: 12 (ref 5–15)
BUN: 16 mg/dL (ref 6–20)
CO2: 23 mmol/L (ref 22–32)
Calcium: 9.1 mg/dL (ref 8.9–10.3)
Chloride: 104 mmol/L (ref 98–111)
Creatinine, Ser: 0.74 mg/dL (ref 0.44–1.00)
GFR, Estimated: >60 mL/min (ref >60)
Glucose, Bld: 96 mg/dL (ref 70–99)
Potassium: 4.2 mmol/L (ref 3.5–5.1)
Sodium: 139 mmol/L (ref 135–145)
Total Bilirubin: 1 mg/dL (ref 0.3–1.2)
Total Protein: 6.8 g/dL (ref 6.5–8.1)

## 2022-06-01 LAB — CBC
HCT: 40.4 % (ref 36.0–46.0)
Hemoglobin: 13.5 g/dL (ref 12.0–15.0)
MCH: 28.4 pg (ref 26.0–34.0)
MCHC: 33.4 g/dL (ref 30.0–36.0)
MCV: 84.9 fL (ref 80.0–100.0)
Platelets: 170 10*3/uL (ref 150–400)
RBC: 4.76 MIL/uL (ref 3.87–5.11)
RDW: 13.5 % (ref 11.5–15.5)
WBC: 7.9 10*3/uL (ref 4.0–10.5)
nRBC: 0 % (ref 0.0–0.2)

## 2022-06-01 LAB — POCT PREGNANCY, URINE: Preg Test, Ur: POSITIVE — AB

## 2022-06-01 MED ORDER — SCOPOLAMINE 1 MG/3DAYS TD PT72: 1.0000 | MEDICATED_PATCH | TRANSDERMAL | 12 refills | Status: DC

## 2022-06-01 MED ORDER — LACTATED RINGERS IV BOLUS
1000.0000 mL | Freq: Once | INTRAVENOUS | Status: AC
  Administered 2022-06-01: 1000 mL via INTRAVENOUS

## 2022-06-01 MED ORDER — METOCLOPRAMIDE HCL 10 MG PO TABS: 10.0000 mg | ORAL_TABLET | Freq: Four times a day (QID) | ORAL | 0 refills | Status: DC

## 2022-06-01 MED ORDER — METOCLOPRAMIDE HCL 5 MG/ML IJ SOLN
10.0000 mg | Freq: Once | INTRAMUSCULAR | Status: AC
  Administered 2022-06-01: 10 mg via INTRAVENOUS
  Filled 2022-06-01: qty 2

## 2022-06-01 MED ORDER — SCOPOLAMINE 1 MG/3DAYS TD PT72
1.0000 | MEDICATED_PATCH | Freq: Once | TRANSDERMAL | Status: DC
  Administered 2022-06-01: 1.5 mg via TRANSDERMAL
  Filled 2022-06-01: qty 1

## 2022-06-01 NOTE — MAU Provider Note (Cosign Needed Addendum)
History     CSN: [id number]  Arrival date and time: 06/01/22 9379   Event Date/Time   First Provider Initiated Contact with Patient 06/01/22 2031      Chief Complaint  Patient presents with   Emesis   Lindsay Horn , a  36 y.o. [id number] at 23w3dpresents to MAU with complaints of nausea and vomiting that has worsened since last night. Patient endorses 10-15 episodes of emesis since last night. She denies being able to keep anything down. Endorses feeling light headed and dizzy and complains of generalized weakness. Denies pain. Patient has has 3 episodes of vomiting since arrival to MAU. Attempted an additional B6 vitamin without relief and denies other management options.                  Dr. BMelba Cooncalled MAU to alert providers of patient arrival. 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   Delayed delivery after spontaneous rupture of membranes 04/03/2020   Family history of breast cancer 08/19/2021   Family history of pancreatic cancer 08/19/2021   Fetal demise, less than 22 weeks 12/11/2014   Medical history non-contributory    S/P cesarean section 11/28/2016   Status post repeat low transverse cesarean section 04/04/2020    Past Surgical History:  Procedure Laterality Date   APPENDECTOMY     APPENDECTOMY  2003   CESAREAN SECTION N/A 11/28/2016   Procedure: CESAREAN SECTION;  Surgeon: BJanyth Contes MD;  [place] [facility]  Service: Obstetrics;  Laterality: N/A;   CESAREAN SECTION N/A 04/04/2020   Procedure: CESAREAN SECTION;  Surgeon: BJanyth Contes MD;  [facility];  Service: Obstetrics;   Laterality: N/A;  Tracey RNFA   DILATION AND EVACUATION N/A 12/12/2014   Procedure: DILATATION AND EVACUATION (D&E) 2ND TRIMESTER with ultrasound guidance;  Surgeon: JJanyth Contes MD;  [facility];  Service: Gynecology;  Laterality: N/A;   HYSTEROSCOPY     removal of uterine septum   WISDOM TOOTH EXTRACTION  2003    Family History  Problem Relation Age of Onset   Breast cancer Mother        GT negative in 2015   Skin cancer Sister    Skin cancer Brother        dx 243s  Hodgkin's lymphoma Paternal Aunt        dx 529s  Throat cancer Paternal AAunt 42  Breast cancer Maternal Grandmother 72   Skin cancer Maternal Grandfather        SCC; late 457s  Colon cancer Maternal Grandfather        dx 558s  Pancreatic cancer Paternal Grandfather 612  Hodgkin's lymphoma Paternal Grandfather 346  Breast cancer Other        MGM's mother d. 854s-90s  Ovarian cancer Other        MGM's  mother d. 38s-90s   Melanoma Other        MGM's father    Social History   Tobacco Use   Smoking status: Never   Smokeless tobacco: Never  Substance Use Topics   Alcohol use: Not Currently    Alcohol/week: 4.0 standard drinks of alcohol    Types: 4 Cans of beer per week   Drug use: No    Allergies:  Allergies  Allergen Reactions   Eggs Or Egg-Derived Products Nausea And Vomiting    GI cramping  vomiting   Codeine Hives    Pt reports taking percocet in the past with no problems    Medications Prior to Admission  Medication Sig Dispense Refill Last Dose   doxylamine, Sleep, (UNISOM) 25 MG tablet Take 25 mg by mouth at bedtime as needed.   06/01/2022   Prenatal Vit-Fe Fumarate-FA (PRENATAL MULTIVITAMIN) TABS tablet Take 1 tablet by mouth daily at 12 noon.   06/01/2022   pyridoxine (B-6) 100 MG tablet Take 100 mg by mouth daily.   06/01/2022   acetaminophen (TYLENOL) 325 MG tablet Take 2 tablets (650 mg total) by mouth every 4 (four) hours as needed (for pain scale < 4). 30 tablet 1     ibuprofen (ADVIL) 800 MG tablet Take 1 tablet (800 mg total) by mouth every 8 (eight) hours as needed for moderate pain or cramping. 30 tablet 1    loratadine (CLARITIN) 10 MG tablet Take 1 tablet by mouth daily.       Review of Systems  Constitutional:  Negative for chills, fatigue and fever.  Eyes:  Negative for pain and visual disturbance.  Respiratory:  Negative for apnea, shortness of breath and wheezing.   Cardiovascular:  Negative for chest pain and palpitations.  Gastrointestinal:  Positive for nausea and vomiting. Negative for abdominal pain, constipation and diarrhea.  Genitourinary:  Negative for difficulty urinating, dysuria, pelvic pain, vaginal bleeding, vaginal discharge and vaginal pain.  Musculoskeletal:  Negative for back pain.  Neurological:  Positive for dizziness, weakness and light-headedness. Negative for seizures and headaches.  Psychiatric/Behavioral:  Negative for suicidal ideas.    Physical Exam   Blood pressure 136/75, pulse 97, temperature 98.8 F (37.1 C), temperature source Oral, resp. rate 16, height '5\' 4"'$  (1.626 m), weight 107.1 kg, last menstrual period 04/10/2022, SpO2 100 %, unknown if currently breastfeeding.  Physical Exam Vitals and nursing note reviewed.  Constitutional:      General: She is not in acute distress.    Appearance: Normal appearance.  HENT:     Head: Normocephalic.  Pulmonary:     Effort: Pulmonary effort is normal.  Musculoskeletal:        General: Normal range of motion.     Cervical back: Normal range of motion.  Skin:    General: Skin is warm and dry.     Capillary Refill: Capillary refill takes less than 2 seconds.  Neurological:     Mental Status: She is alert and oriented to person, place, and time.  Psychiatric:        Mood and Affect: Mood normal.     MAU Course  Procedures Orders Placed This Encounter  Procedures   Culture, OB Urine   Urinalysis, Routine w reflex microscopic Urine, Clean Catch    Comprehensive metabolic panel   CBC   Pregnancy, urine POC   Insert peripheral IV   Results for orders placed or performed during the hospital encounter of 06/01/22 (from the past 24 hour(s))  Pregnancy, urine POC     Status: Abnormal   Collection Time: 06/01/22  7:41 PM  Result Value Ref Range   Preg Test, Ur POSITIVE (A) NEGATIVE  Urinalysis, Routine w reflex microscopic Urine, Clean Catch     Status: Abnormal   Collection Time: 06/01/22  7:58 PM  Result Value Ref Range   Color, Urine AMBER (A) YELLOW   APPearance HAZY (A) CLEAR   Specific Gravity, Urine 1.031 (H) 1.005 - 1.030   pH 6.0 5.0 - 8.0   Glucose, UA NEGATIVE NEGATIVE mg/dL   Hgb urine dipstick MODERATE (A) NEGATIVE   Bilirubin Urine MODERATE (A) NEGATIVE   Ketones, ur 80 (A) NEGATIVE mg/dL   Protein, ur 30 (A) NEGATIVE mg/dL   Nitrite NEGATIVE NEGATIVE   Leukocytes,Ua TRACE (A) NEGATIVE   RBC / HPF 0-5 0 - 5 RBC/hpf   WBC, UA 6-10 0 - 5 WBC/hpf   Bacteria, UA RARE (A) NONE SEEN   Squamous Epithelial / LPF 0-5 0 - 5   Mucus PRESENT   Comprehensive metabolic panel     Status: None   Collection Time: 06/01/22  9:06 PM  Result Value Ref Range   Sodium 139 135 - 145 mmol/L   Potassium 4.2 3.5 - 5.1 mmol/L   Chloride 104 98 - 111 mmol/L   CO2 23 22 - 32 mmol/L   Glucose, Bld 96 70 - 99 mg/dL   BUN 16 6 - 20 mg/dL   Creatinine, Ser 0.74 0.44 - 1.00 mg/dL   Calcium 9.1 8.9 - 10.3 mg/dL   Total Protein 6.8 6.5 - 8.1 g/dL   Albumin 3.7 3.5 - 5.0 g/dL   AST 26 15 - 41 U/L   ALT 29 0 - 44 U/L   Alkaline Phosphatase 70 38 - 126 U/L   Total Bilirubin 1.0 0.3 - 1.2 mg/dL   GFR, Estimated >60 >60 mL/min   Anion gap 12 5 - 15  CBC     Status: None   Collection Time: 06/01/22  9:06 PM  Result Value Ref Range   WBC 7.9 4.0 - 10.5 K/uL   RBC 4.76 3.87 - 5.11 MIL/uL   Hemoglobin 13.5 12.0 - 15.0 g/dL   HCT 40.4 36.0 - 46.0 %   MCV 84.9 80.0 - 100.0 fL   MCH 28.4 26.0 - 34.0 pg   MCHC 33.4 30.0 - 36.0 g/dL   RDW 13.5  11.5 - 15.5 %   Platelets 170 150 - 400 K/uL   nRBC 0.0 0.0 - 0.2 %    MDM UA reflexed to Culture  LR bolus ordered based on UA results  - Report and transfer of care given to H. Norman Herrlich, CNM.   Shantonette Isaias Sakai) Rollene Rotunda, MSN, [facility] for [facility]  06/01/22 10:38 PM   2239: Patient has had 2L of LR, '10mg'$  reglan and scopolamine patch applied. She is tolerating PO at this time and has not had any further emesis since being here.    Assessment and Plan   1. Nausea/vomiting in pregnancy   2. [redacted] weeks gestation of pregnancy    DC home in stable condition  Comfort measures reviewed  1st Trimester precautions  RX: reglan '10mg'$  PRN #30, Scopolamine patch q72 hours  Return to MAU as needed FU with OB as planned   Follow-up Information     [facility] Follow up.   Contact information: [address] [place] [address] [phone]  Marcille Buffy DNP, CNM  06/01/22  10:42 PM

## 2022-06-01 NOTE — MAU Note (Signed)
..  Lindsay Horn is a 36 y.o. at Unknown here in MAU reporting: n/v since last night and has vomited a dozen times since then. Denies pain. Reports vaginal bleeding, had an [place] today and was told she has a subchorionic hematoma, the bleeding is now brown.  LMP: 04/10/2022  Pain score: 0/10 Vitals:   06/01/22 1942  BP: 136/75  Pulse: 97  Resp: 16  Temp: 98.8 F (37.1 C)  SpO2: 100%     FHT:not indicated Lab orders placed from triage: POCT preg

## 2022-06-03 LAB — CULTURE, OB URINE

## 2022-06-16 LAB — OB RESULTS CONSOLE ABO/RH: RH Type: POSITIVE

## 2022-06-16 LAB — OB RESULTS CONSOLE ANTIBODY SCREEN: Antibody Screen: NEGATIVE

## 2022-06-16 LAB — OB RESULTS CONSOLE GC/CHLAMYDIA
Chlamydia: NEGATIVE
Neisseria Gonorrhea: NEGATIVE

## 2022-06-16 LAB — OB RESULTS CONSOLE RPR: RPR: NONREACTIVE

## 2022-06-16 LAB — OB RESULTS CONSOLE HEPATITIS B SURFACE ANTIGEN: Hepatitis B Surface Ag: NEGATIVE

## 2022-06-16 LAB — OB RESULTS CONSOLE RUBELLA ANTIBODY, IGM: Rubella: IMMUNE

## 2022-06-16 LAB — HEPATITIS C ANTIBODY: HCV Ab: NEGATIVE

## 2022-06-16 LAB — OB RESULTS CONSOLE HIV ANTIBODY (ROUTINE TESTING): HIV: NONREACTIVE

## 2022-07-06 ENCOUNTER — Other Ambulatory Visit: Payer: Self-pay

## 2022-10-06 ENCOUNTER — Other Ambulatory Visit: Payer: Self-pay | Admitting: Obstetrics and Gynecology

## 2022-10-06 DIAGNOSIS — Z6841 Body Mass Index (BMI) 40.0 and over, adult: Secondary | ICD-10-CM

## 2022-10-29 ENCOUNTER — Encounter: Payer: Self-pay | Admitting: *Deleted

## 2022-10-29 ENCOUNTER — Ambulatory Visit (HOSPITAL_BASED_OUTPATIENT_CLINIC_OR_DEPARTMENT_OTHER): Payer: BC Managed Care – PPO

## 2022-10-29 ENCOUNTER — Ambulatory Visit: Payer: BC Managed Care – PPO | Attending: Obstetrics and Gynecology | Admitting: *Deleted

## 2022-10-29 VITALS — BP 113/52 | HR 79

## 2022-10-29 DIAGNOSIS — E669 Obesity, unspecified: Secondary | ICD-10-CM

## 2022-10-29 DIAGNOSIS — O09293 Supervision of pregnancy with other poor reproductive or obstetric history, third trimester: Secondary | ICD-10-CM | POA: Insufficient documentation

## 2022-10-29 DIAGNOSIS — O99213 Obesity complicating pregnancy, third trimester: Secondary | ICD-10-CM | POA: Insufficient documentation

## 2022-10-29 DIAGNOSIS — O2623 Pregnancy care for patient with recurrent pregnancy loss, third trimester: Secondary | ICD-10-CM

## 2022-10-29 DIAGNOSIS — Z3A28 28 weeks gestation of pregnancy: Secondary | ICD-10-CM | POA: Diagnosis not present

## 2022-10-29 DIAGNOSIS — O99891 Other specified diseases and conditions complicating pregnancy: Secondary | ICD-10-CM | POA: Insufficient documentation

## 2022-10-29 DIAGNOSIS — Z87718 Personal history of other specified (corrected) congenital malformations of genitourinary system: Secondary | ICD-10-CM

## 2022-10-29 DIAGNOSIS — O34219 Maternal care for unspecified type scar from previous cesarean delivery: Secondary | ICD-10-CM | POA: Insufficient documentation

## 2022-10-29 DIAGNOSIS — Z6841 Body Mass Index (BMI) 40.0 and over, adult: Secondary | ICD-10-CM

## 2022-10-29 DIAGNOSIS — Z363 Encounter for antenatal screening for malformations: Secondary | ICD-10-CM | POA: Insufficient documentation

## 2022-11-18 ENCOUNTER — Other Ambulatory Visit: Payer: BC Managed Care – PPO

## 2022-11-18 ENCOUNTER — Ambulatory Visit: Payer: BC Managed Care – PPO

## 2022-12-28 NOTE — Patient Instructions (Signed)
Lindsay Horn  12/28/2022   Your procedure is scheduled on:  01/11/2023  Arrive at 0800 at Entrance C on [employer] at [facility]  and [employer]. You are invited to use the FREE valet parking or use the Visitor's parking deck.  Pick up the phone at the desk and dial [phone].  Call this number if you have problems the morning of surgery: [phone]  Remember:   Do not eat food:(After Midnight) Desps de medianoche.  Do not drink clear liquids: (After Midnight) Desps de medianoche.  Take these medicines the morning of surgery with A SIP OF WATER:  none   Do not wear jewelry, make-up or nail polish.  Do not wear lotions, powders, or perfumes. Do not wear deodorant.  Do not shave 48 hours prior to surgery.  Do not bring valuables to the hospital.  [facility] is not   responsible for any belongings or valuables brought to the hospital.  Contacts, dentures or bridgework may not be worn into surgery.  Leave suitcase in the car. After surgery it may be brought to your room.  For patients admitted to the hospital, checkout time is 11:00 AM the day of              discharge.      Please read over the following fact sheets that you were given:     Preparing for Surgery

## 2022-12-29 ENCOUNTER — Encounter (HOSPITAL_COMMUNITY): Payer: Self-pay

## 2023-01-07 NOTE — H&P (Signed)
Lindsay Horn is a 37 y.o.G6P2032 female presenting for scheduled repeat cesarean section at 22 3/7wks. Pt has a hx of c/s x 2 and SAB x 3. 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   Delayed delivery after spontaneous rupture of membranes 04/03/2020   Depression    Family history of breast cancer 08/19/2021   Family history of pancreatic cancer 08/19/2021   Fetal demise, less than 22 weeks 12/11/2014   GERD (gastroesophageal reflux disease)    Medical history non-contributory    S/P cesarean section 11/28/2016   Status post repeat low transverse cesarean section 04/04/2020   Past Surgical History:  Procedure Laterality Date   APPENDECTOMY     APPENDECTOMY  2003   CESAREAN SECTION N/A 11/28/2016   Procedure: CESAREAN SECTION;  Surgeon: Sherian Rein, MD;  Location: [facility];  Service: Obstetrics;  Laterality: N/A;   CESAREAN SECTION N/A 04/04/2020   Procedure: CESAREAN SECTION;  Surgeon: Sherian Rein, MD;  Location: [facility] LD ORS;  Service: Obstetrics;  Laterality: N/A;  Tracey RNFA   DILATION AND EVACUATION N/A 12/12/2014   Procedure: DILATATION AND EVACUATION (D&E) 2ND TRIMESTER with ultrasound guidance;  Surgeon: Sherian Rein, MD;  Location: [facility] ORS;  Service: Gynecology;  Laterality: N/A;   HYSTEROSCOPY     removal of uterine septum   WISDOM TOOTH EXTRACTION  2003   Family History: family history includes Breast cancer in her mother and another family member; Breast cancer (age of onset: 70) in her maternal grandmother; Colon cancer in her maternal grandfather; Hodgkin's lymphoma in her paternal aunt; Hodgkin's lymphoma  (age of onset: 63) in her paternal grandfather; Melanoma in an other family member; Ovarian cancer in an other family member; Pancreatic cancer (age of onset: 51) in her paternal grandfather; Skin cancer in her brother, maternal grandfather, and sister; Throat cancer (age of onset: 15) in her paternal aunt. Social History:  reports that she has never smoked. She has never used smokeless tobacco. She reports that she does not currently use alcohol after a past usage of about 4.0 standard drinks of alcohol per week. She reports that she does not use drugs.     Maternal Diabetes: No Genetic Screening: Normal Maternal Ultrasounds/Referrals: Normal Fetal Ultrasounds or other Referrals:  None Maternal Substance Abuse:  No Significant Maternal Medications:  None Significant Maternal Lab Results:  Group B Strep negative Number of Prenatal Visits:greater than 3 verified prenatal visits Other Comments:  None  Review of Systems  Constitutional:  Positive for fatigue. Negative for activity change and diaphoresis.  Eyes:  Negative for photophobia and visual disturbance.  Respiratory:  Negative for chest tightness and shortness of breath.   Cardiovascular:  Positive for leg swelling. Negative for chest pain and palpitations.  Gastrointestinal:  Positive for abdominal distention. Negative for abdominal pain.  Genitourinary:  Positive for pelvic pain.  Musculoskeletal:  Positive for back pain.  Neurological:  Negative for light-headedness  and headaches.  Psychiatric/Behavioral:  The patient is not nervous/anxious.    Maternal Medical History:  Reason for admission: Repeat cesarean section planned  Contractions: Frequency: rare.   Perceived severity is mild.   Fetal activity: Perceived fetal activity is normal.   Prenatal complications: no prenatal complications Prenatal Complications - Diabetes: none.     Last menstrual period 04/10/2022, unknown if currently breastfeeding. Maternal Exam:   Uterine Assessment: Contraction frequency is rare.  Abdomen: Patient reports generalized tenderness.  Estimated fetal weight is AGA.   Fetal presentation: vertex Introitus: Normal vulva. Normal vagina.  Pelvis: of concern for delivery.     Fetal Exam Fetal Monitor Review: Pattern: no decelerations.     Physical Exam Vitals reviewed.  Constitutional:      Appearance: Normal appearance. She is obese.  Cardiovascular:     Rate and Rhythm: Normal rate.  Pulmonary:     Effort: Pulmonary effort is normal.  Abdominal:     Tenderness: There is generalized abdominal tenderness.  Genitourinary:    General: Normal vulva.  Musculoskeletal:        General: Normal range of motion.     Cervical back: Normal range of motion.  Skin:    General: Skin is warm and dry.     Capillary Refill: Capillary refill takes 2 to 3 seconds.  Neurological:     General: No focal deficit present.     Mental Status: She is alert and oriented to person, place, and time. Mental status is at baseline.  Psychiatric:        Mood and Affect: Mood normal.        Behavior: Behavior normal.        Thought Content: Thought content normal.        Judgment: Judgment normal.     Prenatal labs: ABO, Rh: A/Positive/-- (11/28 0000) Antibody: Negative (11/28 0000) Rubella: Immune (11/28 0000) RPR: Nonreactive (11/28 0000)  HBsAg: Negative (11/28 0000)  HIV: Non-reactive (11/28 0000)  GBS:     Assessment/Plan: 42 female presenting for repeat c/s at 39 3/7wks  - Admit - ERAS protocol  - Verify consent  - To OR when ready    Janean Sark Charlayne Vultaggio 01/07/2023, 11:22 AM

## 2023-01-08 ENCOUNTER — Encounter (HOSPITAL_COMMUNITY)
Admission: RE | Admit: 2023-01-08 | Discharge: 2023-01-08 | Disposition: A | Discharge status: Home / Self Care | Payer: BC Managed Care – PPO | Source: Ambulatory Visit | Attending: Obstetrics and Gynecology | Admitting: Obstetrics and Gynecology

## 2023-01-08 DIAGNOSIS — O34219 Maternal care for unspecified type scar from previous cesarean delivery: Secondary | ICD-10-CM | POA: Insufficient documentation

## 2023-01-08 LAB — CBC
HCT: 36.9 % (ref 36.0–46.0)
Hemoglobin: 12.2 g/dL (ref 12.0–15.0)
MCH: 29.5 pg (ref 26.0–34.0)
MCHC: 33.1 g/dL (ref 30.0–36.0)
MCV: 89.3 fL (ref 80.0–100.0)
Platelets: 183 10*3/uL (ref 150–400)
RBC: 4.13 MIL/uL (ref 3.87–5.11)
RDW: 13.7 % (ref 11.5–15.5)
WBC: 7.4 10*3/uL (ref 4.0–10.5)
nRBC: 0 % (ref 0.0–0.2)

## 2023-01-08 LAB — TYPE AND SCREEN
ABO/RH(D): A POS
Antibody Screen: NEGATIVE

## 2023-01-08 LAB — RPR: RPR Ser Ql: NONREACTIVE

## 2023-01-10 ENCOUNTER — Encounter (HOSPITAL_COMMUNITY): Payer: Self-pay | Admitting: Obstetrics and Gynecology

## 2023-01-11 ENCOUNTER — Encounter (HOSPITAL_COMMUNITY): Payer: Self-pay | Admitting: Obstetrics and Gynecology

## 2023-01-11 ENCOUNTER — Encounter (HOSPITAL_COMMUNITY)
Admission: AD | Disposition: A | Discharge status: Home / Self Care | Payer: Self-pay | Source: Home / Self Care | Attending: Obstetrics and Gynecology

## 2023-01-11 ENCOUNTER — Other Ambulatory Visit: Payer: Self-pay

## 2023-01-11 ENCOUNTER — Inpatient Hospital Stay (HOSPITAL_COMMUNITY): Payer: BC Managed Care – PPO

## 2023-01-11 ENCOUNTER — Inpatient Hospital Stay (HOSPITAL_COMMUNITY)
Admission: AD | Admit: 2023-01-11 | Discharge: 2023-01-13 | DRG: 788 | Disposition: A | Discharge status: Home / Self Care | Payer: BC Managed Care – PPO | Attending: Obstetrics and Gynecology | Admitting: Obstetrics and Gynecology

## 2023-01-11 DIAGNOSIS — O34219 Maternal care for unspecified type scar from previous cesarean delivery: Secondary | ICD-10-CM | POA: Diagnosis present

## 2023-01-11 DIAGNOSIS — O99214 Obesity complicating childbirth: Secondary | ICD-10-CM | POA: Diagnosis present

## 2023-01-11 DIAGNOSIS — O34211 Maternal care for low transverse scar from previous cesarean delivery: Secondary | ICD-10-CM | POA: Diagnosis present

## 2023-01-11 DIAGNOSIS — Z3A39 39 weeks gestation of pregnancy: Secondary | ICD-10-CM | POA: Diagnosis not present

## 2023-01-11 SURGERY — Surgical Case
Anesthesia: Spinal | Site: Abdomen

## 2023-01-11 MED ORDER — MORPHINE SULFATE (PF) 0.5 MG/ML IJ SOLN
INTRAMUSCULAR | Status: DC | PRN
  Administered 2023-01-11: 150 ug via INTRATHECAL

## 2023-01-11 MED ORDER — CEFAZOLIN IN SODIUM CHLORIDE 3-0.9 GM/100ML-% IV SOLN
INTRAVENOUS | Status: AC
  Filled 2023-01-11: qty 100

## 2023-01-11 MED ORDER — SOD CITRATE-CITRIC ACID 500-334 MG/5ML PO SOLN
ORAL | Status: AC
  Filled 2023-01-11: qty 30

## 2023-01-11 MED ORDER — WITCH HAZEL-GLYCERIN EX PADS: 1.0000 | MEDICATED_PAD | CUTANEOUS | Status: DC | PRN

## 2023-01-11 MED ORDER — PRENATAL MULTIVITAMIN CH
1.0000 | ORAL_TABLET | Freq: Every day | ORAL | Status: DC
  Administered 2023-01-11 – 2023-01-13 (×3): 1 via ORAL
  Filled 2023-01-11 (×3): qty 1

## 2023-01-11 MED ORDER — PANTOPRAZOLE SODIUM 40 MG PO TBEC
40.0000 mg | DELAYED_RELEASE_TABLET | Freq: Every day | ORAL | Status: DC
  Administered 2023-01-11 – 2023-01-13 (×3): 40 mg via ORAL
  Filled 2023-01-11 (×3): qty 1

## 2023-01-11 MED ORDER — NALOXONE HCL 4 MG/10ML IJ SOLN: 1.0000 ug/kg/h | INTRAVENOUS | Status: DC | PRN

## 2023-01-11 MED ORDER — MENTHOL 3 MG MT LOZG: 1.0000 | LOZENGE | OROMUCOSAL | Status: DC | PRN

## 2023-01-11 MED ORDER — ONDANSETRON HCL 4 MG/2ML IJ SOLN
INTRAMUSCULAR | Status: AC
  Filled 2023-01-11: qty 2

## 2023-01-11 MED ORDER — ACETAMINOPHEN 10 MG/ML IV SOLN
INTRAVENOUS | Status: AC
  Filled 2023-01-11: qty 100

## 2023-01-11 MED ORDER — SODIUM CHLORIDE 0.9 % IR SOLN
Status: DC | PRN
  Administered 2023-01-11: 1000 mL

## 2023-01-11 MED ORDER — DIPHENHYDRAMINE HCL 25 MG PO CAPS: 25.0000 mg | ORAL_CAPSULE | Freq: Four times a day (QID) | ORAL | Status: DC | PRN

## 2023-01-11 MED ORDER — KETOROLAC TROMETHAMINE 30 MG/ML IJ SOLN
INTRAMUSCULAR | Status: AC
  Filled 2023-01-11: qty 1

## 2023-01-11 MED ORDER — LACTATED RINGERS IV SOLN: INTRAVENOUS | Status: DC

## 2023-01-11 MED ORDER — DEXMEDETOMIDINE HCL IN NACL 80 MCG/20ML IV SOLN: INTRAVENOUS | Status: DC | PRN

## 2023-01-11 MED ORDER — OXYTOCIN 10 UNIT/ML IJ SOLN
INTRAMUSCULAR | Status: AC
  Filled 2023-01-11: qty 4

## 2023-01-11 MED ORDER — TRANEXAMIC ACID-NACL 1000-0.7 MG/100ML-% IV SOLN
1000.0000 mg | Freq: Once | INTRAVENOUS | Status: AC
  Administered 2023-01-11: 1000 mg via INTRAVENOUS

## 2023-01-11 MED ORDER — ACETAMINOPHEN 10 MG/ML IV SOLN
1000.0000 mg | Freq: Once | INTRAVENOUS | Status: AC
  Administered 2023-01-11: 1000 mg via INTRAVENOUS

## 2023-01-11 MED ORDER — PHENYLEPHRINE HCL-NACL 20-0.9 MG/250ML-% IV SOLN
INTRAVENOUS | Status: DC | PRN
  Administered 2023-01-11: 60 ug/min via INTRAVENOUS

## 2023-01-11 MED ORDER — DIPHENHYDRAMINE HCL 25 MG PO CAPS: 25.0000 mg | ORAL_CAPSULE | ORAL | Status: DC | PRN

## 2023-01-11 MED ORDER — ZOLPIDEM TARTRATE 5 MG PO TABS: 5.0000 mg | ORAL_TABLET | Freq: Every evening | ORAL | Status: DC | PRN

## 2023-01-11 MED ORDER — FENTANYL CITRATE (PF) 100 MCG/2ML IJ SOLN
INTRAMUSCULAR | Status: DC | PRN
  Administered 2023-01-11: 15 ug via INTRATHECAL

## 2023-01-11 MED ORDER — METHYLERGONOVINE MALEATE 0.2 MG PO TABS
0.2000 mg | ORAL_TABLET | Freq: Four times a day (QID) | ORAL | Status: DC
  Administered 2023-01-11 (×2): 0.2 mg via ORAL
  Filled 2023-01-11 (×2): qty 1

## 2023-01-11 MED ORDER — MORPHINE SULFATE (PF) 0.5 MG/ML IJ SOLN
INTRAMUSCULAR | Status: AC
  Filled 2023-01-11: qty 10

## 2023-01-11 MED ORDER — SOD CITRATE-CITRIC ACID 500-334 MG/5ML PO SOLN
30.0000 mL | ORAL | Status: AC
  Administered 2023-01-11: 30 mL via ORAL

## 2023-01-11 MED ORDER — OXYCODONE HCL 5 MG PO TABS
5.0000 mg | ORAL_TABLET | ORAL | Status: DC | PRN
  Administered 2023-01-13: 5 mg via ORAL
  Filled 2023-01-11: qty 1

## 2023-01-11 MED ORDER — ACETAMINOPHEN 500 MG PO TABS
1000.0000 mg | ORAL_TABLET | Freq: Four times a day (QID) | ORAL | Status: DC
  Administered 2023-01-11 – 2023-01-13 (×8): 1000 mg via ORAL
  Filled 2023-01-11 (×8): qty 2

## 2023-01-11 MED ORDER — SENNOSIDES-DOCUSATE SODIUM 8.6-50 MG PO TABS
2.0000 | ORAL_TABLET | Freq: Every day | ORAL | Status: DC
  Administered 2023-01-12 – 2023-01-13 (×2): 2 via ORAL
  Filled 2023-01-11 (×2): qty 2

## 2023-01-11 MED ORDER — SIMETHICONE 80 MG PO CHEW
80.0000 mg | CHEWABLE_TABLET | Freq: Three times a day (TID) | ORAL | Status: DC
  Administered 2023-01-11 – 2023-01-13 (×6): 80 mg via ORAL
  Filled 2023-01-11 (×6): qty 1

## 2023-01-11 MED ORDER — POVIDONE-IODINE 10 % EX SWAB
2.0000 | Freq: Once | CUTANEOUS | Status: AC
  Administered 2023-01-11: 2 via TOPICAL

## 2023-01-11 MED ORDER — ONDANSETRON HCL 4 MG/2ML IJ SOLN: 4.0000 mg | Freq: Three times a day (TID) | INTRAMUSCULAR | Status: DC | PRN

## 2023-01-11 MED ORDER — OXYTOCIN-SODIUM CHLORIDE 30-0.9 UT/500ML-% IV SOLN
2.5000 [IU]/h | INTRAVENOUS | Status: DC
  Administered 2023-01-11: 2.5 [IU]/h via INTRAVENOUS
  Filled 2023-01-11: qty 500

## 2023-01-11 MED ORDER — COCONUT OIL OIL: 1.0000 | TOPICAL_OIL | Status: DC | PRN

## 2023-01-11 MED ORDER — NALOXONE HCL 0.4 MG/ML IJ SOLN: 0.4000 mg | INTRAMUSCULAR | Status: DC | PRN

## 2023-01-11 MED ORDER — STERILE WATER FOR IRRIGATION IR SOLN
Status: DC | PRN
  Administered 2023-01-11: 1000 mL

## 2023-01-11 MED ORDER — SODIUM CHLORIDE 0.9% FLUSH: 3.0000 mL | INTRAVENOUS | Status: DC | PRN

## 2023-01-11 MED ORDER — KETOROLAC TROMETHAMINE 30 MG/ML IJ SOLN: 30.0000 mg | Freq: Four times a day (QID) | INTRAMUSCULAR | Status: AC | PRN

## 2023-01-11 MED ORDER — CEFAZOLIN IN SODIUM CHLORIDE 3-0.9 GM/100ML-% IV SOLN
3.0000 g | INTRAVENOUS | Status: AC
  Administered 2023-01-11: 3 g via INTRAVENOUS

## 2023-01-11 MED ORDER — ONDANSETRON HCL 4 MG/2ML IJ SOLN
INTRAMUSCULAR | Status: DC | PRN
  Administered 2023-01-11: 4 mg via INTRAVENOUS

## 2023-01-11 MED ORDER — FENTANYL CITRATE (PF) 100 MCG/2ML IJ SOLN
INTRAMUSCULAR | Status: AC
  Filled 2023-01-11: qty 2

## 2023-01-11 MED ORDER — DEXMEDETOMIDINE HCL IN NACL 80 MCG/20ML IV SOLN
INTRAVENOUS | Status: DC | PRN
  Administered 2023-01-11: 8 ug via INTRAVENOUS

## 2023-01-11 MED ORDER — ACETAMINOPHEN 500 MG PO TABS: 1000.0000 mg | ORAL_TABLET | Freq: Once | ORAL | Status: DC

## 2023-01-11 MED ORDER — SIMETHICONE 80 MG PO CHEW
80.0000 mg | CHEWABLE_TABLET | ORAL | Status: DC | PRN
  Administered 2023-01-11: 80 mg via ORAL
  Filled 2023-01-11: qty 1

## 2023-01-11 MED ORDER — METHYLERGONOVINE MALEATE 0.2 MG PO TABS: 0.2000 mg | ORAL_TABLET | Freq: Three times a day (TID) | ORAL | Status: DC

## 2023-01-11 MED ORDER — DIBUCAINE (PERIANAL) 1 % EX OINT: 1.0000 | TOPICAL_OINTMENT | CUTANEOUS | Status: DC | PRN

## 2023-01-11 MED ORDER — BUPIVACAINE IN DEXTROSE 0.75-8.25 % IT SOLN
INTRATHECAL | Status: DC | PRN
  Administered 2023-01-11: 1.6 mL via INTRATHECAL

## 2023-01-11 MED ORDER — ACETAMINOPHEN 500 MG PO TABS
1000.0000 mg | ORAL_TABLET | Freq: Four times a day (QID) | ORAL | Status: DC
Start: 2023-01-11 — End: 2023-01-11

## 2023-01-11 MED ORDER — TRANEXAMIC ACID-NACL 1000-0.7 MG/100ML-% IV SOLN
INTRAVENOUS | Status: AC
  Filled 2023-01-11: qty 100

## 2023-01-11 MED ORDER — ACETAMINOPHEN 160 MG/5ML PO SOLN
1000.0000 mg | Freq: Once | ORAL | Status: DC
Start: 2023-01-11 — End: 2023-01-11

## 2023-01-11 MED ORDER — DROPERIDOL 2.5 MG/ML IJ SOLN: 0.6250 mg | Freq: Once | INTRAMUSCULAR | Status: DC | PRN

## 2023-01-11 MED ORDER — FENTANYL CITRATE (PF) 100 MCG/2ML IJ SOLN: 25.0000 ug | INTRAMUSCULAR | Status: DC | PRN

## 2023-01-11 MED ORDER — OXYTOCIN-SODIUM CHLORIDE 30-0.9 UT/500ML-% IV SOLN
INTRAVENOUS | Status: AC
  Filled 2023-01-11: qty 1000

## 2023-01-11 MED ORDER — IBUPROFEN 600 MG PO TABS
600.0000 mg | ORAL_TABLET | Freq: Four times a day (QID) | ORAL | Status: DC
  Administered 2023-01-11 – 2023-01-13 (×8): 600 mg via ORAL
  Filled 2023-01-11 (×8): qty 1

## 2023-01-11 MED ORDER — KETOROLAC TROMETHAMINE 30 MG/ML IJ SOLN
30.0000 mg | Freq: Once | INTRAMUSCULAR | Status: AC
  Administered 2023-01-11: 30 mg via INTRAVENOUS

## 2023-01-11 MED ORDER — OXYTOCIN-SODIUM CHLORIDE 30-0.9 UT/500ML-% IV SOLN
INTRAVENOUS | Status: DC | PRN
  Administered 2023-01-11: 300 mL via INTRAVENOUS

## 2023-01-11 MED ORDER — DIPHENHYDRAMINE HCL 50 MG/ML IJ SOLN
INTRAMUSCULAR | Status: AC
  Filled 2023-01-11: qty 1

## 2023-01-11 MED ORDER — PHENYLEPHRINE HCL (PRESSORS) 10 MG/ML IV SOLN
INTRAVENOUS | Status: DC | PRN
  Administered 2023-01-11 (×3): 40 ug via INTRAVENOUS

## 2023-01-11 MED ORDER — DIPHENHYDRAMINE HCL 50 MG/ML IJ SOLN
12.5000 mg | INTRAMUSCULAR | Status: DC | PRN
  Administered 2023-01-11: 12.5 mg via INTRAVENOUS

## 2023-01-11 MED ORDER — DEXAMETHASONE SODIUM PHOSPHATE 10 MG/ML IJ SOLN
INTRAMUSCULAR | Status: DC | PRN
  Administered 2023-01-11: 10 mg via INTRAVENOUS

## 2023-01-11 MED ORDER — DEXMEDETOMIDINE HCL IN NACL 80 MCG/20ML IV SOLN
INTRAVENOUS | Status: AC
  Filled 2023-01-11: qty 20

## 2023-01-11 SURGICAL SUPPLY — 40 items
APL PRP STRL LF DISP 70% ISPRP (MISCELLANEOUS) ×2
APL SKNCLS NONHYPOALLERGENIC (GAUZE/BANDAGES/DRESSINGS) ×1
APL SKNCLS STERI-STRIP NONHPOA (GAUZE/BANDAGES/DRESSINGS) ×1
BENZOIN TINCTURE PRP APPL 2/3 (GAUZE/BANDAGES/DRESSINGS) IMPLANT
CHLORAPREP W/TINT 26 (MISCELLANEOUS) ×4 IMPLANT
CLAMP UMBILICAL CORD (MISCELLANEOUS) ×2 IMPLANT
CLOTH BEACON ORANGE TIMEOUT ST (SAFETY) ×2 IMPLANT
DRAPE C SECTION CLR SCREEN (DRAPES) ×2 IMPLANT
DRSG OPSITE POSTOP 4X10 (GAUZE/BANDAGES/DRESSINGS) ×2 IMPLANT
ELECT REM PT RETURN 9FT ADLT (ELECTROSURGICAL) ×1
ELECTRODE REM PT RTRN 9FT ADLT (ELECTROSURGICAL) ×2 IMPLANT
EXTRACTOR VACUUM KIWI (MISCELLANEOUS) IMPLANT
GAUZE PAD ABD 7.5X8 STRL (GAUZE/BANDAGES/DRESSINGS) IMPLANT
GAUZE SPONGE 4X4 12PLY STRL LF (GAUZE/BANDAGES/DRESSINGS) IMPLANT
GLOVE BIO SURGEON STRL SZ 6.5 (GLOVE) ×2 IMPLANT
GLOVE BIOGEL PI IND STRL 7.0 (GLOVE) ×4 IMPLANT
GOWN STRL REUS W/TWL LRG LVL3 (GOWN DISPOSABLE) ×4 IMPLANT
KIT ABG SYR 3ML LUER SLIP (SYRINGE) IMPLANT
NDL HYPO 25X5/8 SAFETYGLIDE (NEEDLE) IMPLANT
NEEDLE HYPO 25X5/8 SAFETYGLIDE (NEEDLE) IMPLANT
NS IRRIG 1000ML POUR BTL (IV SOLUTION) ×2 IMPLANT
PACK C SECTION WH (CUSTOM PROCEDURE TRAY) ×2 IMPLANT
PAD OB MATERNITY 4.3X12.25 (PERSONAL CARE ITEMS) ×2 IMPLANT
RETRACTOR WND ALEXIS 25 LRG (MISCELLANEOUS) ×2 IMPLANT
RTRCTR C-SECT PINK 25CM LRG (MISCELLANEOUS) IMPLANT
RTRCTR WOUND ALEXIS 25CM LRG (MISCELLANEOUS) ×1
STRIP CLOSURE SKIN 1/2X4 (GAUZE/BANDAGES/DRESSINGS) IMPLANT
SUT CHROMIC 1 CTX 36 (SUTURE) ×4 IMPLANT
SUT PLAIN 0 NONE (SUTURE) IMPLANT
SUT PLAIN 2 0 XLH (SUTURE) ×2 IMPLANT
SUT VIC AB 0 CT1 27 (SUTURE) ×2
SUT VIC AB 0 CT1 27XBRD ANBCTR (SUTURE) ×4 IMPLANT
SUT VIC AB 2-0 CT1 27 (SUTURE) ×1
SUT VIC AB 2-0 CT1 TAPERPNT 27 (SUTURE) ×2 IMPLANT
SUT VIC AB 3-0 CT1 27 (SUTURE)
SUT VIC AB 3-0 CT1 TAPERPNT 27 (SUTURE) IMPLANT
SUT VIC AB 4-0 KS 27 (SUTURE) ×2 IMPLANT
TOWEL OR 17X24 6PK STRL BLUE (TOWEL DISPOSABLE) ×2 IMPLANT
TRAY FOLEY W/BAG SLVR 14FR LF (SET/KITS/TRAYS/PACK) ×2 IMPLANT
WATER STERILE IRR 1000ML POUR (IV SOLUTION) ×2 IMPLANT

## 2023-01-11 NOTE — Anesthesia Procedure Notes (Signed)
Spinal  Patient location during procedure: OR Start time: 01/11/2023 10:04 AM End time: 01/11/2023 10:07 AM Reason for block: surgical anesthesia Staffing Performed: anesthesiologist  Anesthesiologist: Kaylyn Layer, MD Performed by: Kaylyn Layer, MD Authorized by: Kaylyn Layer, MD   Preanesthetic Checklist Completed: patient identified, IV checked, risks and benefits discussed, monitors and equipment checked, pre-op evaluation and timeout performed Spinal Block Patient position: sitting Prep: DuraPrep and site prepped and draped Patient monitoring: heart rate, continuous pulse ox and blood pressure Approach: midline Location: L3-4 Injection technique: single-shot Needle Needle type: Pencan  Needle gauge: 24 G Needle length: 10 cm Assessment Sensory level: T4 Events: CSF return Additional Notes Risks, benefits, and alternative discussed. Patient gave consent to procedure. Prepped and draped in sitting position. Clear CSF obtained after one needle pass. Positive terminal aspiration. No pain or paraesthesias with injection. Patient tolerated procedure well. Vital signs stable. Amalia Greenhouse, MD

## 2023-01-11 NOTE — Anesthesia Postprocedure Evaluation (Signed)
Anesthesia Post Note  Patient: Candise Crabtree  Procedure(s) Performed: CESAREAN SECTION (Abdomen)     Patient location during evaluation: PACU Anesthesia Type: Spinal Level of consciousness: awake and alert Pain management: pain level controlled Vital Signs Assessment: post-procedure vital signs reviewed and stable Respiratory status: spontaneous breathing, nonlabored ventilation and respiratory function stable Cardiovascular status: blood pressure returned to baseline Postop Assessment: no apparent nausea or vomiting, spinal receding, no headache and no backache Anesthetic complications: no   No notable events documented.  Last Vitals:  Vitals:   01/11/23 1245 01/11/23 1345  BP: 117/68 108/71  Pulse: 75 84  Resp: 18 18  Temp: 36.7 C 36.7 C  SpO2: 98% 98%    Last Pain:  Vitals:   01/11/23 1345  TempSrc: Oral  PainSc: 2    Pain Goal: Patients Stated Pain Goal: 4 (01/11/23 1129)                 Shanda Howells

## 2023-01-11 NOTE — Op Note (Addendum)
Operative Note    Preoperative Diagnosis IUP at 39 3/7wks  Previous cesarean section x 2   Postoperative Diagnosis: Same    Procedure: Repeat cesarean section with no extensions    Surgeon: Britt Bottom DO  Assist: Darleen Crocker DO    An experienced assistant was required given the standard of surgical care given the complexity of the case and maternal body habitus.  This assistant was needed for exposure, dissection, suctioning, retraction, instrument exchange, assisting with delivery with administration of fundal pressure, and for overall help during the procedure.    Anesthesia:  Spinal   Fluids: LR 1L EBL: UOP:  Received 3gm ancef, 1gm tylenol and 1000mg  tranexamic acid preop  Findings: Viable female infant in vertex position, APGARS 9,9, Weight 6lbs 6oz          Grossly normal uterus, tubes and ovaries   Specimen: Placenta to L/D   Procedure Note  Consent verified pre-op. All questions answered   Patient was taken to the operating room where spinal anesthesia was administered. She was prepped and draped in the normal sterile fashion and placed in the dorsal supine position with a leftward tilt. An appropriate time out was performed.   A Pfannenstiel skin incision was then made. The previous two incision scars were excised. The incision was carried through to the underlying layer of fascia by sharp dissection and Bovie cautery. The fascia was nicked in the midline and the incision was extended laterally with Mayo scissors. The superior and inferior aspects of the incision were grasped kocher clamps and dissected off the underlying rectus muscles. There was moderate scarring noted.  The rectus muscles were also slightly scarred. They were separated in the midline  and the peritoneal cavity entered bluntly. The peritoneal incision was then extended both superiorly and inferiorly with careful attention to avoid both bowel and bladder.   The Alexis  self-retaining wound retractor was then placed and the lower uterine segment exposed. The bladder flap was developed with Metzenbaum scissors and pushed away from the lower uterine segment. The lower uterine segment was then incised in a transverse fashion and the cavity itself entered bluntly. The incision was extended bluntly. There was thin meconium stained fluid noted.  The infant's head was then lifted and delivered from the incision without difficulty. Vigorous spontaneous cry was noted during delivery.  The remainder of the infant delivered and the nose and mouth bulb suctioned. The cord was clamped and cut after a minute delay. The infant was handed off to the waiting NICU team.   The placenta was then spontaneously expressed from the uterus and the uterus cleared of all clots and debris with moist lap sponge. The uterine incision was then repaired in 2 layers the first layer was a running locked layer 1-0 chromic and the second an imbricating layer of the same suture. The tubes and ovaries were inspected and the gutters cleared of all clots and debris. The uterine incision was inspected and found to be hemostatic. All instruments and sponges were then removed from the abdomen.   The peritoneum was then reapproximated in a running fashion with sutures of 2-0 Vicryl.  The fascia was then closed with 0 Vicryl in a running fashion. Subcutaneous tissue was reapproximated with 3-0 plain in a running fashion. The skin was closed with a subcuticular stitch of 4-0 Vicryl on a Keith needle and then reinforced with benzoin and Steri-Strips.   At the conclusion of the procedure all instruments and sponge counts  were correct. Patient was taken to the recovery room in good condition with her baby accompanying her skin to skin.

## 2023-01-11 NOTE — Interval H&P Note (Signed)
History and Physical Interval Note: Pt seen and examined. Reviewed expectations in surgery. No change since H/P done.  Consent confirmed To OR when ready  01/11/2023 9:36 AM  Lindsay Horn  has presented today for surgery, with the diagnosis of repeat cesarean section, two previous.  The various methods of treatment have been discussed with the patient and family. After consideration of risks, benefits and other options for treatment, the patient has consented to  Procedure(s) with comments: CESAREAN SECTION (N/A) - request OB Fellow Assist as a surgical intervention.  The patient's history has been reviewed, patient examined, no change in status, stable for surgery.  I have reviewed the patient's chart and labs.  Questions were answered to the patient's satisfaction.     Cathrine Muster

## 2023-01-11 NOTE — Progress Notes (Signed)
Called Dr Mindi Slicker patient having trickle with fundal assessments and repositioning. Ordered two doses of methergine and wants called back.

## 2023-01-11 NOTE — Anesthesia Preprocedure Evaluation (Addendum)
Anesthesia Evaluation  Patient identified by MRN, date of birth, ID band Patient awake    Reviewed: Allergy & Precautions, NPO status , Patient's Chart, lab work & pertinent test results  History of Anesthesia Complications Negative for: history of anesthetic complications  Airway Mallampati: II  TM Distance: >3 FB Neck ROM: Full    Dental no notable dental hx.    Pulmonary neg pulmonary ROS   Pulmonary exam normal        Cardiovascular negative cardio ROS Normal cardiovascular exam     Neuro/Psych    Depression       GI/Hepatic Neg liver ROS,GERD  ,,  Endo/Other    Morbid obesity  Renal/GU negative Renal ROS     Musculoskeletal negative musculoskeletal ROS (+)    Abdominal   Peds  Hematology negative hematology ROS (+)   Anesthesia Other Findings Day of surgery medications reviewed with patient.  Reproductive/Obstetrics (+) Pregnancy (Hx of C/S x2)                              Anesthesia Physical Anesthesia Plan  ASA: 3  Anesthesia Plan: Spinal   Post-op Pain Management:    Induction:   PONV Risk Score and Plan: 4 or greater and Treatment may vary due to age or medical condition, Ondansetron and Dexamethasone  Airway Management Planned: Natural Airway  Additional Equipment: None  Intra-op Plan:   Post-operative Plan:   Informed Consent: I have reviewed the patients History and Physical, chart, labs and discussed the procedure including the risks, benefits and alternatives for the proposed anesthesia with the patient or authorized representative who has indicated his/her understanding and acceptance.       Plan Discussed with: CRNA  Anesthesia Plan Comments:         Anesthesia Quick Evaluation

## 2023-01-11 NOTE — Transfer of Care (Signed)
Immediate Anesthesia Transfer of Care Note  Patient: Lindsay Horn  Procedure(s) Performed: CESAREAN SECTION (Abdomen)  Patient Location: PACU  Anesthesia Type:Spinal  Level of Consciousness: awake, alert , and oriented  Airway & Oxygen Therapy: Patient Spontanous Breathing  Post-op Assessment: Report given to RN and Post -op Vital signs reviewed and stable  Post vital signs: Reviewed and stable  Last Vitals:  Vitals Value Taken Time  BP 127/63 01/11/23 1129  Temp 36.8 C 01/11/23 1129  Pulse 84 01/11/23 1131  Resp 20 01/11/23 1131  SpO2 97 % 01/11/23 1131  Vitals shown include unvalidated device data.  Last Pain:  Vitals:   01/11/23 1129  TempSrc: Oral         Complications: No notable events documented.

## 2023-01-11 NOTE — Lactation Note (Signed)
This note was copied from a baby's chart. Lactation Consultation Note  Patient Name: Lindsay Horn [id number] Date: 01/11/2023 Age:34 hours Reason for consult: Initial assessment;Term   P3, [redacted]w[redacted]d, cesarean delivery  P3 mother with years of breastfeeding experience. She reports baby Lindsay is breastfeeding well. Denies any questions or concerns at this time.  Mom made aware of O/P services, breastfeeding support groups, and our phone # for post-discharge questions.    Maternal Data Does the patient have breastfeeding experience prior to this delivery?: Yes How long did the patient breastfeed?: 15 months to 2.5 years  Feeding Mother's Current Feeding Choice: Breast Milk    Consult Status Consult Status: Follow-up Date: 01/12/23 Follow-up type: In-patient    Christella Hartigan M 01/11/2023, 6:54 PM

## 2023-01-12 LAB — CBC
HCT: 31 % — ABNORMAL LOW (ref 36.0–46.0)
Hemoglobin: 10.2 g/dL — ABNORMAL LOW (ref 12.0–15.0)
MCH: 28.5 pg (ref 26.0–34.0)
MCHC: 32.9 g/dL (ref 30.0–36.0)
MCV: 86.6 fL (ref 80.0–100.0)
Platelets: 173 10*3/uL (ref 150–400)
RBC: 3.58 MIL/uL — ABNORMAL LOW (ref 3.87–5.11)
RDW: 13.2 % (ref 11.5–15.5)
WBC: 12 10*3/uL — ABNORMAL HIGH (ref 4.0–10.5)
nRBC: 0 % (ref 0.0–0.2)

## 2023-01-12 NOTE — Progress Notes (Signed)
Subjective: Postpartum Day 1: Cesarean Delivery Patient reports pain controlled, tolerating p.o., ambulating and voiding without difficulty.  Objective: Vital signs in last 24 hours: Temp:  [98 F (36.7 C)-98.3 F (36.8 C)] 98.1 F (36.7 C) (06/25 0520) Pulse Rate:  [63-91] 79 (06/25 0520) Resp:  [16-18] 16 (06/25 0520) BP: (95-117)/(53-72) 104/53 (06/25 0520) SpO2:  [98 %-100 %] 100 % (06/25 0520)  Physical Exam:  General: alert, cooperative, and appears stated age Lochia: appropriate Uterine Fundus: firm Incision: healing well DVT Evaluation: No evidence of DVT seen on physical exam.  Recent Labs    01/12/23 0530  HGB 10.2*  HCT 31.0*    Assessment/Plan: Status post Cesarean section. Doing well postoperatively.  Continue current care.  Waynard Reeds, MD 01/12/2023, 12:11 PM

## 2023-01-12 NOTE — Lactation Note (Signed)
This note was copied from a baby's chart. Lactation Consultation Note  Patient Name: Girl Therese Rocco [id number] Date: 01/12/2023 Age:35 hours  Reason for consult: Follow-up assessment;Infant weight loss;Term  P3, [redacted]w[redacted]d, 6% weight loss  Upon entry to room, mother was breastfeeding. Infant was latched well, wide gape and swallows noted. Mother states she feels her colostrum volume is increasing. She denies any questions or concerns. She is familiar with cluster feeding. Hopeful for discharge tomorrow. Will request help as needed.       Feeding Mother's Current Feeding Choice: Breast Milk  LATCH Score Latch: Grasps breast easily, tongue down, lips flanged, rhythmical sucking.  Audible Swallowing: Spontaneous and intermittent  Type of Nipple: Everted at rest and after stimulation  Comfort (Breast/Nipple): Soft / non-tender  Hold (Positioning): No assistance needed to correctly position infant at breast.  LATCH Score: 10      Discharge Pump: Personal  Consult Status Consult Status: Follow-up Date: 01/13/23 Follow-up type: In-patient    Christella Hartigan M 01/12/2023, 6:48 PM

## 2023-01-13 LAB — BIRTH TISSUE RECOVERY COLLECTION (PLACENTA DONATION)

## 2023-01-13 MED ORDER — OXYCODONE HCL 5 MG PO TABS: 5.0000 mg | ORAL_TABLET | ORAL | 0 refills | Status: AC | PRN

## 2023-01-13 NOTE — Social Work (Signed)
CSW received consult for hx of Depression. CSW met with MOB to offer support and complete assessment.  CSW entered the room and observed MOB in the chair holding the infant and FOB at bedside, CSW introduced self, CSW role and reason for visit. MOB was agreeable to visit and allowed FOB to remain in the room during the assessment. CSW inquired about how MOB was feeling, MOB reported good. CSW inquired about MOB MH hx, MOB reported she has a "bout of depression" after a miscarriage in 2016. MOB reported a stable mood since that time and throughout this pregnancy. MOB denied any PPD with her older children and has no concerns with her mood. CSW provided education regarding the baby blues period vs. perinatal mood disorders, discussed treatment and gave resources for mental health follow up if concerns arise.  CSW recommends self-evaluation during the postpartum time period using the New Mom Checklist from Postpartum Progress and encouraged MOB to contact a medical professional if symptoms are noted at any time. MOB identified FOB her parents an sister as her supports.    CSW provided review of Sudden Infant Death Syndrome (SIDS) precautions.  MOB identified [facility] for infants follow up. MOB reported they have all necessary items for the infant including a bassinet, crib and a car seat.  CSW identifies no further need for intervention and no barriers to discharge at this time.  Wende Neighbors, LCSWA Clinical Social Worker [phone]

## 2023-01-13 NOTE — Discharge Summary (Signed)
Postpartum Discharge Summary     Patient Name: Lindsay Horn DOB: 1986-04-07 MRN: [id number]  Date of admission: 01/11/2023 Delivery date:01/11/2023  Delivering provider: Pryor Ochoa [facility]  Date of discharge: 01/13/2023  Admitting diagnosis: Previous cesarean delivery affecting pregnancy [O34.219] Intrauterine pregnancy: [redacted]w[redacted]d     Secondary diagnosis:  Principal Problem:   Previous cesarean delivery affecting pregnancy  Additional problems: morbid obesity    Discharge diagnosis: Term Pregnancy Delivered                                              Post partum procedures: none Augmentation: N/A Complications: None  Hospital course: Sceduled C/S   37 y.o. yo [id number] at [redacted]w[redacted]d was admitted to the hospital 01/11/2023 for scheduled cesarean section with the following indication:Elective Repeat.Delivery details are as follows:  Membrane Rupture Time/Date: 10:32 AM ,01/11/2023   Delivery Method:C-Section, Low Transverse  Details of operation can be found in separate operative note.  Patient had a postpartum course complicated bynothing.  She is ambulating, tolerating a regular diet, passing flatus, and urinating well. Patient is discharged home in stable condition on  01/13/23        Newborn Data: Birth date:01/11/2023  Birth time:10:33 AM  Gender:Female  Living status:Living  Apgars:9 ,9  Weight:2880 g     Magnesium Sulfate received: No BMZ received: No Rhophylac:N/A Transfusion:No  Physical exam  Vitals:   01/12/23 0520 01/12/23 1430 01/12/23 2041 01/13/23 0521  BP: (!) 104/53 (!) 108/49 123/64 (!) 104/57  Pulse: 79 72 72 78  Resp: 16 17 18 18   Temp: 98.1 F (36.7 C) 98.3 F (36.8 C) 98.1 F (36.7 C) 98.1 F (36.7 C)  TempSrc: Oral Oral Oral Oral  SpO2: 100%  99% 100%  Weight:      Height:       General: alert, cooperative, and no distress Lochia: appropriate Uterine Fundus: firm Incision: Healing well with no significant drainage, No significant erythema,  Dressing is clean, dry, and intact DVT Evaluation: No evidence of DVT seen on physical exam. Labs: Lab Results  Component Value Date   WBC 12.0 (H) 01/12/2023   HGB 10.2 (L) 01/12/2023   HCT 31.0 (L) 01/12/2023   MCV 86.6 01/12/2023   PLT 173 01/12/2023      Latest Ref Rng & Units 06/01/2022    9:06 PM  CMP  Glucose 70 - 99 mg/dL 96   BUN 6 - 20 mg/dL 16   Creatinine 6.30 - 1.00 mg/dL 1.60   Sodium 109 - 323 mmol/L 139   Potassium 3.5 - 5.1 mmol/L 4.2   Chloride 98 - 111 mmol/L 104   CO2 22 - 32 mmol/L 23   Calcium 8.9 - 10.3 mg/dL 9.1   Total Protein 6.5 - 8.1 g/dL 6.8   Total Bilirubin 0.3 - 1.2 mg/dL 1.0   Alkaline Phos 38 - 126 U/L 70   AST 15 - 41 U/L 26   ALT 0 - 44 U/L 29    Edinburgh Score:    01/11/2023    1:05 PM  Edinburgh Postnatal Depression Scale Screening Tool  I have been able to laugh and see the funny side of things. 0  I have looked forward with enjoyment to things. 0  I have blamed myself unnecessarily when things went wrong. 0  I have been anxious or worried for no good  reason. 1  I have felt scared or panicky for no good reason. 0  Things have been getting on top of me. 0  I have been so unhappy that I have had difficulty sleeping. 0  I have felt sad or miserable. 0  I have been so unhappy that I have been crying. 0  The thought of harming myself has occurred to me. 0  Edinburgh Postnatal Depression Scale Total 1      After visit meds:  Allergies as of 01/13/2023       Reactions   Egg-derived Products Nausea And Vomiting   GI cramping  vomiting   Codeine Hives   Pt reports taking percocet in the past with no problems   Pine Other (See Comments)   Pine nut-gi upset        Medication List     STOP taking these medications    calcium carbonate 500 MG chewable tablet Commonly known as: TUMS - dosed in mg elemental calcium   scopolamine 1 MG/3DAYS Commonly known as: TRANSDERM-SCOP       TAKE these medications     acetaminophen 500 MG tablet Commonly known as: TYLENOL Take 500-1,000 mg by mouth every 6 (six) hours as needed (pain.).   oxyCODONE 5 MG immediate release tablet Commonly known as: Oxy IR/ROXICODONE Take 1-2 tablets (5-10 mg total) by mouth every 4 (four) hours as needed for moderate pain.   prenatal multivitamin Tabs tablet Take 1 tablet by mouth at bedtime.               Discharge Care Instructions  (From admission, onward)           Start     Ordered   01/13/23 0000  Discharge wound care:       Comments: Can remove dressing and steri strips 5 days from date of surgery   01/13/23 0844             Discharge home in stable condition  Infant Disposition:home with mother Discharge instruction: per After Visit Summary and Postpartum booklet. Activity: Advance as tolerated. Pelvic rest for 6 weeks.  Diet: routine diet Anticipated Birth Control: Unsure Postpartum Appointment:2 weeks Additional Postpartum F/U: Postpartum Depression checkup, incision check Future Appointments: Patient states appt is scheduled Follow up Visit:  Follow-up Information     Edwinna Areola, DO. Go in 2 week(s).   Specialty: Obstetrics and Gynecology Why: Appt is already scheduled Contact information: [address] [place] [address] [place] [address] [phone]                     01/13/2023 Philip Aspen, DO

## 2023-02-02 ENCOUNTER — Telehealth (HOSPITAL_COMMUNITY): Payer: Self-pay | Admitting: *Deleted

## 2023-02-02 NOTE — Telephone Encounter (Signed)
02/02/2023  Name: Lindsay Horn MRN: [id number] DOB: [birth year]  Reason for Call:  Transition of Care Hospital Discharge Call  Contact Status: Patient Contact Status: Complete  Language assistant needed:          Follow-Up Questions: Do You Have Any Concerns About Your Health As You Heal From Delivery?: No Do You Have Any Concerns About Your Infants Health?: No  Edinburgh Postnatal Depression Scale:  In the Past 7 Days:   EPDS not completed at this time. Patient reported having completed EPDS with a home visiting nures. Stated, "I feel great." PHQ2-9 Depression Scale:     Discharge Follow-up: Edinburgh score requires follow up?: N/A Patient was advised of the following resources:: Breastfeeding Support Group, Support Group  Post-discharge interventions: Reviewed Newborn Safe Sleep Practices  Signature Deforest Hoyles, RN, [phone]

## 2024-08-17 ENCOUNTER — Telehealth: Payer: Self-pay

## 2024-08-17 ENCOUNTER — Inpatient Hospital Stay

## 2024-08-17 DIAGNOSIS — F32A Depression, unspecified: Secondary | ICD-10-CM | POA: Insufficient documentation

## 2024-08-17 DIAGNOSIS — O210 Mild hyperemesis gravidarum: Secondary | ICD-10-CM | POA: Insufficient documentation

## 2024-08-17 DIAGNOSIS — N912 Amenorrhea, unspecified: Secondary | ICD-10-CM | POA: Insufficient documentation

## 2024-08-17 DIAGNOSIS — N926 Irregular menstruation, unspecified: Secondary | ICD-10-CM | POA: Insufficient documentation

## 2024-08-17 DIAGNOSIS — Q513 Bicornate uterus: Secondary | ICD-10-CM | POA: Insufficient documentation

## 2024-08-17 DIAGNOSIS — N764 Abscess of vulva: Secondary | ICD-10-CM | POA: Insufficient documentation

## 2024-08-17 NOTE — Telephone Encounter (Signed)
 I contacted Lindsay Horn today about her genetic counseling visit. She was previously seen in the [facility] in 2023 and met with Kirk Orleans, [facility]. She has a strong maternal family history of breast cancer, and multiple maternal relatives have completed genetic testing and tested negative, including her mother. Additionally, her paternal grandfather had a history of pancreatic cancer. In 2023, she declined genetic testing and planned to follow up if any additional paternal relatives developed cancer.  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 Her sister completed genetic testing and was identified to have acute intermittent porphyria, and cascade testing was recommended.  Lindsay Horn shared that at this time she was not interested in pursuing cancer genetic testing but did wish to move forward with AIP cascade testing based on her sisters result. Her cancer genetic counseling visit was canceled, and her referral was shifted to [facility]. She was encouraged to reach out if she ever wishes to have cancer genetic testing.  Lindsay Fryer, MS, CGC  Certified Genetic Counselor  Email: [email or web address]  Phone: [phone]

## 2024-08-17 NOTE — Progress Notes (Unsigned)
 REFERRING PROVIDER: Danielle Rom, MD [address] [place],  [place] [address]  PRIMARY PROVIDER:  Danielle Rom, MD  PRIMARY REASON FOR VISIT:  No diagnosis found.  HISTORY OF PRESENT ILLNESS:   Kordelia Severin, a 39 y.o. female, was seen for a [facility] cancer genetics consultation at the request of ***Bovard-Stuckert, MD*** due to a {Personal/family:20331} history of {cancer/polyps}.  Imogen Escandon presents to clinic today to discuss the possibility of a hereditary predisposition to cancer, genetic testing, and to further clarify her future cancer risks, as well as potential cancer risks for family members.   CANCER HISTORY:  Iria Jamerson is a 39 y.o. female with no personal history of cancer.    RISK FACTORS Menarche was at age 37.  First live birth at age 58.  OCP use for less than 2 years.  ***Ovaries intact: yes.  ***Hysterectomy: no.  Menopausal status: premenopausal.  HRT use: 0 years. Colonoscopy: no; not examined. Mammogram within the last year: no. Number of breast biopsies: 0. 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   Delayed delivery after spontaneous rupture of membranes 04/03/2020   Depression    Family history of breast cancer 08/19/2021   Family history of pancreatic cancer 08/19/2021   Fetal demise, less than 22 weeks 12/11/2014   GERD (gastroesophageal reflux disease)    Medical history non-contributory    S/P cesarean section 11/28/2016   Status post repeat low transverse cesarean section 04/04/2020   Past Surgical History:  Procedure Laterality Date   APPENDECTOMY     APPENDECTOMY  2003   CESAREAN SECTION N/A 11/28/2016   Procedure: CESAREAN SECTION;  Surgeon: Danielle Rom, MD;  Location: [facility]  BIRTHING SU[facility];  Service: Obstetrics;  Laterality: N/A;   CESAREAN SECTION N/A 04/04/2020   Procedure: CESAREAN SECTION;  Surgeon: Danielle Rom, MD;  Location: [facility] LD ORS;  Service: Obstetrics;  Laterality: N/A;  Tracey RNFA   CESAREAN SECTION N/A 01/11/2023   Procedure: CESAREAN SECTION;  Surgeon: Delana Ted Morrison, DO;  Location: [facility] LD ORS;  Service: Obstetrics;  Laterality: N/A;  request OB Fellow Assist   DILATION AND EVACUATION N/A 12/12/2014   Procedure: DILATATION AND EVACUATION (D&E) 2ND TRIMESTER with ultrasound guidance;  Surgeon: Rom Danielle, MD;  Location: [facility] ORS;  Service: Gynecology;  Laterality: N/A;   HYSTEROSCOPY     removal of uterine septum   WISDOM TOOTH EXTRACTION  2003   Social History   Socioeconomic History   Marital status: Married    Spouse name: Not on file   Number of children: Not on file   Years of education: Not on file   Highest education level: Not on file  Occupational History   Not on file  Tobacco Use   Smoking status: Never   Smokeless tobacco: Never  Vaping Use   Vaping status: Never Used  Substance and Sexual Activity   Alcohol use: Not Currently    Alcohol/week: 4.0 standard drinks of alcohol    Types: 4 Cans of beer per week   Drug use: No   Sexual activity: Yes    Birth control/protection: None  Other Topics Concern   Not on file  Social History Narrative   ** Merged History Encounter **       Social Drivers of Health   Tobacco  Use: Low Risk (12/16/2023)   Received from [facility]   Patient History    Smoking Tobacco Use: Never    Smokeless Tobacco Use: Never    Passive Exposure: Not on file  Financial Resource Strain: Not on file  Food Insecurity: Patient Declined (01/11/2023)   Hunger Vital Sign    Worried About Running Out of Food in the Last Year: Patient declined    Barista in the Last Year: Patient declined  Transportation Needs: Patient Declined (01/11/2023)   PRAPARE - Therapist, Art (Medical): Patient declined    Lack of Transportation (Non-Medical): Patient declined  Physical Activity: Not on file  Stress: Not on file  Social Connections: Not on file  Depression (PHQ2-9): Not on file  Alcohol Screen: Not on file  Housing: Patient Declined (01/11/2023)   Housing    Last Housing Risk Score: 98  Utilities: Patient Declined (01/11/2023)   AHC Utilities    Threatened with loss of utilities: Patient declined  Health Literacy: Not on file    FAMILY HISTORY:  We obtained a detailed, 4-generation family history pasted below.   Olesya Clubb is ***aware ***unaware of relatives completing genetic testing for hereditary cancer risks.  ***Patient's maternal ancestors are of *** descent, and paternal ancestors are of *** descent.  There {IS NO:12509} reported Ashkenazi Jewish ancestry.  There ***is no known consanguinity.  Pedigree Summary*** Significant diagnoses are listed below: Family History  Problem Relation Age of Onset   Breast cancer Mother        GT negative in 2015   Skin cancer Sister    Skin cancer Brother        dx 39s   Hodgkin's lymphoma Paternal Aunt        dx 49s   Throat cancer Paternal Aunt 51   Breast cancer Maternal Grandmother 72   Skin cancer Maternal Grandfather        SCC; late 17s   Colon cancer Maternal Grandfather        dx 51s   Pancreatic cancer Paternal Grandfather 8   Hodgkin's lymphoma Paternal Grandfather 1   Breast cancer Other        MGM's mother d. 38s-90s   Ovarian cancer Other        MGM's mother d. 76s-90s   Melanoma Other        MGM's father    GENETIC COUNSELING ASSESSMENT: Leauna Sharber is a 39 y.o. 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 We, therefore, discussed and recommended the following at today's visit.   DISCUSSION: We discussed that, in general, most cancer is not  inherited in families, but instead is sporadic or familial. Sporadic cancers occur by chance and typically happen at older ages (>50 years) as this type of cancer is caused by genetic changes acquired during an individuals lifetime. Some families have more cancers than would be expected by chance; however, the ages or types of cancer are not consistent with a known genetic mutation or known genetic mutations have been ruled out. This type of familial cancer is thought to be due to a combination of multiple genetic, environmental, hormonal, and lifestyle factors. While this combination of factors likely increases the risk of cancer, the exact source of this risk is not currently identifiable or testable.  We discussed that 5-10% of cancer is the result of germline (heritable) genetic variants, with most cases  associated with ***BRCA1/BRCA2. There are other genes that can be associated with hereditary *** cancer syndromes. These include ***. We discussed that testing is beneficial for several reasons including knowing how to follow individuals after completing their treatment, identifying whether potential treatment options ***such as PARP inhibitors*** would be beneficial, and understanding if other family members could be at risk for cancer and allow them to undergo genetic testing.   We reviewed the characteristics, features and inheritance patterns of hereditary cancer syndromes. We also discussed genetic testing, including the appropriate family members to test, the process of testing, insurance coverage and turn-around-time for results. We discussed the implications of a negative, positive, carrier and/or variant of uncertain significant result. Nakiyah Hamor  was offered a common hereditary cancer panel (***40 ***48 genes) and an expanded pan-cancer panel (***70***77 genes). Amisadai Turnipseed was informed of the benefits and limitations of each panel, including that expanded pan-cancer panels contain genes  that do not have clear management guidelines at this point in time.  We also discussed that as the number of genes included on a panel increases, the chances of variants of uncertain significance increases.  ***GENETIC TESTING NATIONAL CRITERIA: Based on Jerzi Sizer's {Personal/family:20331} history of cancer she meets medical criteria for genetic testing based on the [employer] (NCCN) guidelines. ***Though Ocia Biel is not personally affected, there are no affected family members that are willing/able/available to undergo hereditary cancer testing. Therefore, Tesha Hageris the most informative family member available. ***Despite that she meets criteria, she may still have an out of pocket cost.  ***Despite that she meets criteria, she  may still have an out of pocket cost. she completed the [facility] patient assistance form in the office and qualified for $0 out of post cost. We discussed that if her out of pocket cost for testing is over $100, the laboratory will send a text with the estimated out-of-pocket cost.  If the out of pocket cost of testing is less than $100 she will be billed by the genetic testing laboratory.   GENETIC TESTING CONSENT:  After considering the risks, benefits, and limitations, Pierce Sow ***did NOT provide***provided informed consent to pursue genetic testing. A blood sample was sent to [facility] for analysis of the ***CancerNext-Expanded+RNA Panel. Results should be available within approximately ***2-3 weeks' time, at which point they will be disclosed by telephone to Elbony Dingledine , as will any additional recommendations warranted by these results. Baby Nova will receive a summary of her genetic counseling visit and a copy of her results once available. This information will also be available in Epic.  ***{INSERT}.jrcambry ***{INSERT} .jrcinvit  ***DOES NOT MEET NCCN CRITERIA ***We discussed with Genisis Schnarr that  the {Personal/family:20331} history does not meet insurance or NCCN criteria for genetic testing and, therefore, is not highly consistent with a familial hereditary cancer syndrome.  We feel she is at low risk to harbor  a gene mutation associated with such a condition. Thus, we did not recommend any genetic testing, at this time, and recommended Natika Tallarico continue to follow the cancer screening guidelines given by her primary healthcare provider.  ***STAT TESTING ***We reviewed the characteristics, features and inheritance patterns of hereditary cancer syndromes. We also discussed genetic testing, including the appropriate family members to test, the process of testing, insurance coverage and turn-around-time for results. We discussed the implications of a negative, positive and/or variant of uncertain significant result. In order to get genetic test results in a timely manner so that Mindi Schuneman can  use these genetic test results for surgical decisions, we recommended Carmalita Rinella pursue genetic testing for the ***. Once complete, we recommend Diora Lesure pursue reflex genetic testing to the *** gene panel.   GENETIC INFORMATION NONDISCRIMINATION ACT (GINA): We discussed that some people do not want to undergo genetic testing due to fear of genetic discrimination.  The Genetic Information Nondiscrimination Act (GINA) was signed into federal law in 2008. GINA prohibits health insurers and most employers from discriminating against individuals based on genetic information (including the results of genetic tests and family history information). According to GINA, health insurance companies cannot consider genetic information to be a preexisting condition, nor can they use it to make decisions regarding coverage or rates. GINA also makes it illegal for most employers to use genetic information in making decisions about hiring, firing, promotion, or terms of employment. It is important to note that  GINA does not offer protections for life insurance, disability insurance, or long-term care insurance. GINA does not apply to those in the [employer], those who work for companies with less than 15 employees, and new life insurance or long-term disability insurance policies.  Health status due to a cancer diagnosis is not protected under GINA. More information about GINA can be found by visiting eliteclients.be. Lastly, we encouraged Lauria Elem to remain in contact with cancer genetics annually so that we can continuously update the family history and inform her of any changes in cancer genetics and testing that may be of benefit for this family.   Darrian Montesinos's questions were answered to her satisfaction today. Our contact information was provided should additional questions or concerns arise. Thank you for the referral and allowing us  to share in the care of your patient.   Resources:  Tywana Robotham was provided with the following:  ***[facility] Billing information  ***[facility] Hereditary Cancer Testing Patient Guide ***[facility] Patient Assistance Information Sheet ***[facility] CancerNext-Expanded + RNAinsight gene list  PLAN:  ***Testing Ordered: ***Clinic Note Faxed/Routed to Lawren Pinheiro's PCP ***Danielle Rom, MD  ***Vaughn Banker Patient Assistance completed during visit (OOP ***$0) ***Declined Testing *** Despite our recommendation, Norva Rigor did not wish to pursue genetic testing at today's visit. We understand this decision and remain available to coordinate genetic testing at any time in the future. We, therefore, recommend Yoshie Henslee continue to follow the cancer screening guidelines given by her primary healthcare provider.  ***MOST INFORMATIVE PERSON TO TEST ***Based on Deirdra Dix's family history, we recommended her ***, who was diagnosed with *** at age ***, have genetic counseling and testing. Kariel Decelle will let us  know if  we can be of any assistance in coordinating genetic counseling and/or testing for this family member.   Santana Fryer, MS, CGC  Certified Genetic Counselor  Email: [email or web address]  Phone: [phone]  I personally spent a total of *** minutes in the care of the patient today including {Time Based Coding:210964241}.  *** The patient was seen alone.  ***The patient was joined by ***. Drs. Lanny Stalls, and/or Gudena were available for questions, if needed. _______________________________________________________________________ For Office Staff:  Number of people involved in session: *** Was an Intern/ student involved with case: {YES/NO:63}

## 2024-11-02 ENCOUNTER — Telehealth: Admitting: Medical Genetics
